# Patient Record
Sex: Female | Born: 1975
Health system: Southern US, Community
[De-identification: ages and names within clinical notes are randomized; demographics above are authoritative.]

## PROBLEM LIST (undated history)

## (undated) DIAGNOSIS — I050 Rheumatic mitral stenosis: Secondary | ICD-10-CM

## (undated) DIAGNOSIS — R002 Palpitations: Secondary | ICD-10-CM

## (undated) DIAGNOSIS — K649 Unspecified hemorrhoids: Secondary | ICD-10-CM

## (undated) DIAGNOSIS — I099 Rheumatic heart disease, unspecified: Secondary | ICD-10-CM

## (undated) HISTORY — PX: NO PAST SURGERIES: SHX2092

## (undated) HISTORY — DX: Rheumatic heart disease, unspecified: I09.9

## (undated) HISTORY — DX: Unspecified hemorrhoids: K64.9

---

## 2009-09-23 ENCOUNTER — Emergency Department (HOSPITAL_COMMUNITY): Admission: EM | Admit: 2009-09-23 | Discharge: 2009-09-23 | Payer: Self-pay | Admitting: Emergency Medicine

## 2009-09-26 ENCOUNTER — Ambulatory Visit: Payer: Self-pay | Admitting: Family Medicine

## 2009-09-26 DIAGNOSIS — K5900 Constipation, unspecified: Secondary | ICD-10-CM | POA: Insufficient documentation

## 2009-09-26 DIAGNOSIS — I099 Rheumatic heart disease, unspecified: Secondary | ICD-10-CM | POA: Insufficient documentation

## 2009-09-26 DIAGNOSIS — I050 Rheumatic mitral stenosis: Secondary | ICD-10-CM | POA: Insufficient documentation

## 2009-10-05 ENCOUNTER — Encounter: Payer: Self-pay | Admitting: Family Medicine

## 2009-11-10 ENCOUNTER — Ambulatory Visit: Payer: Self-pay | Admitting: Family Medicine

## 2009-11-10 DIAGNOSIS — K644 Residual hemorrhoidal skin tags: Secondary | ICD-10-CM | POA: Insufficient documentation

## 2009-12-26 ENCOUNTER — Encounter: Admission: RE | Admit: 2009-12-26 | Discharge: 2009-12-26 | Payer: Self-pay | Admitting: Infectious Diseases

## 2010-01-30 ENCOUNTER — Encounter: Payer: Self-pay | Admitting: *Deleted

## 2010-03-23 ENCOUNTER — Encounter: Payer: Self-pay | Admitting: Family Medicine

## 2010-03-27 ENCOUNTER — Ambulatory Visit: Payer: Self-pay | Admitting: Family Medicine

## 2010-03-27 DIAGNOSIS — L259 Unspecified contact dermatitis, unspecified cause: Secondary | ICD-10-CM | POA: Insufficient documentation

## 2010-11-07 NOTE — Miscellaneous (Signed)
Summary: NPI for [facility]  Clinical Lists Changes gave our NPI number for her last visit & 3 to come.Golden Circle RN  January 30, 2010 4:30 PM

## 2010-11-07 NOTE — Assessment & Plan Note (Signed)
Summary: f/u eo   Vital Signs:  Patient profile:   35 year old female Height:      62.25 inches Weight:      136.6 pounds BMI:     24.87 Temp:     97.7 degrees F oral Pulse rate:   73 / minute BP sitting:   100 / 59  (left arm) Cuff size:   regular  Vitals Entered By: Garen Grams LPN (November 10, 2009 1:51 PM) CC: f/u Is Patient Diabetic? No Pain Assessment Patient in pain? no        Primary Care Provider:  Renold Don MD  CC:  f/u.  History of Present Illness: 36 year old recent immigrant from [place] with history of Rheumatic heart disease.  1.  Heart failure:  saw cardiologist.  Letter included in Centricity notes.  In short, recommended patient continue Digoxin and Atenolol, discontinued Amiodarone because no definitive diagnosis of A. fib.  Patient does complaining of shortness of breath after walking one flight of steps.  Still has palpitations.  Some leg edema, improved with elevation.  However, she does endorse marked improvement in her symptoms, saying she only gets palpitations once a week.  Before it was every day.  Exertion brings on shortness of breath, rest relieves it.    2.  Constipation:  Patient states she has very hard stools.  Also reports growth on her rectum.  If she goes more than one day without having a bowel movement, defecation is very painful for her.  States when she wipes occasionaly sees blood on the toilet paper.  None in toilet bowel.    ROS:  no chest pain, no headaches, no nausea/vomiting.  No dysuria, no hematochezia, no melena, no abdominal pain.    Habits & Providers  Alcohol-Tobacco-Diet     Tobacco Status: never  Current Problems (verified): 1)  Hemorrhoids, External  (ICD-455.3) 2)  Constipation  (ICD-564.00) 3)  Rheumatic Mitral Stenosis  (ICD-394.0) 4)  Rheumatic Heart Disease  (ICD-398.90)  Current Medications (verified): 1)  Digoxin 0.25 Mg Tabs (Digoxin) .... Take 1 Pill Daily 2)  Atenolol 50 Mg Tabs (Atenolol) .... Take 1  Pill Daily 3)  Anusol-Hc 2.5 % Crea (Hydrocortisone) 4)  Colace 100 Mg Caps (Docusate Sodium) .... Take 1 Pill in Am and 1 Pill in Pm For Constipation 5)  Miralax  Powd (Polyethylene Glycol 3350) .... Put 1 Capful in Juice or Water Every Day For Constipation  Allergies (verified): No Known Drug Allergies  Social History: Smoking Status:  never  Physical Exam  General:  Vital signs reviewed Well-developed, well-nourished patient in NAD.  Awake, cooperative.  Mouth:  oral mucosa moist Neck:  no lymphadenopathy Lungs:  clear to ausculation, no crackles Heart:  RRR, opening snap heard over mitral valve.   Abdomen:  soft/nontender/nondistended.  No organomegaly, bowel sounds present.  Rectal:  External hermorrhoid visible on right anal verge.  Painful to palpation.  No thrombosis.  Normal sphincter tone.   Extremities:  trace edema bilaterally    Impression & Recommendations:  Problem # 1:  RHEUMATIC HEART DISEASE (ICD-398.90) Assessment Improved Improvement in symptoms since taking medication regularly.  Discontinued Amiodarone per cardiology.  Per most recent letter, stated they wanted to obtain Echo as well as Holter monitor to assess atrial enlargement and palpitations.  No record of either in Centricity or E-chart.  Will contact cardiologist to see if tests obtained.  Spent over 45 minutes with patient, with >30 minutes discussing implications of heart  failure.  Plan to see patient back in 2 months to re-evaluate.   Orders: [facility]- Est  Level 4 (16109)  Problem # 2:  HEMORRHOIDS, EXTERNAL (ICD-455.3) Assessment: New Non-thrombosed extrenal hemorrhoids.  Recommended patient take sitz baths.  Prescribed Anusol cream for relief.  Also discussed constipation, see next.   Orders: [facility]- Est  Level 4 (99214)  Problem # 3:  CONSTIPATION (ICD-564.00) Assessment: Unchanged Painful for patient secondary to stool hardness.  Discussed high fiber diet.  Also prescribed Colace as softener and  Miralax to help relieve constipation and relieve pain from hemorrhoids.   Her updated medication list for this problem includes:    Colace 100 Mg Caps (Docusate sodium) .Marland Kitchen... Take 1 pill in am and 1 pill in pm for constipation    Miralax Powd (Polyethylene glycol 3350) .Marland Kitchen... Put 1 capful in juice or water every day for constipation  Orders: [facility]- Est  Level 4 (60454)  Complete Medication List: 1)  Digoxin 0.25 Mg Tabs (Digoxin) .... Take 1 pill daily 2)  Atenolol 50 Mg Tabs (Atenolol) .... Take 1 pill daily 3)  Anusol-hc 2.5 % Crea (Hydrocortisone) 4)  Colace 100 Mg Caps (Docusate sodium) .... Take 1 pill in am and 1 pill in pm for constipation 5)  Miralax Powd (Polyethylene glycol 3350) .... Put 1 capful in juice or water every day for constipation Prescriptions: MIRALAX  POWD (POLYETHYLENE GLYCOL 3350) Put 1 capful in juice or water every day for constipation  #1 x 2   Entered and Authorized by:   Renold Don MD   Signed by:   Renold Don MD on 11/10/2009   Method used:   Print then Give to Patient   RxID:   [id number] COLACE 100 MG CAPS (DOCUSATE SODIUM) Take 1 pill in AM and 1 pill in PM for constipation  #60 x 3   Entered and Authorized by:   Renold Don MD   Signed by:   Renold Don MD on 11/10/2009   Method used:   Print then Give to Patient   RxID:   [id number] ANUSOL-HC 2.5 % CREA (HYDROCORTISONE)   #1 x 3   Entered and Authorized by:   Renold Don MD   Signed by:   Renold Don MD on 11/10/2009   Method used:   Print then Give to Patient   RxID:   [id number]

## 2010-11-07 NOTE — Assessment & Plan Note (Signed)
Summary: allergies  itching/[place]   Vital Signs:  Patient profile:   35 year old female Weight:      142.1 pounds Pulse rate:   70 / minute BP sitting:   100 / 60  (right arm) Cuff size:   regular  Vitals Entered By: Renato Battles slade,cma CC: left leg and face itching x 20 days. Is Patient Diabetic? No Pain Assessment Patient in pain? no        Primary Care Provider:  Renold Don MD  CC:  left leg and face itching x 20 days.Marland Kitchen  History of Present Illness: Megan Atkins comes in with itchy rash on legs, arm, and face for 3 weeks.  No fever or other symptoms.  REd lesions on skin that are dry and itch.  Has not had before, but recently emigrated here from [place].   Habits & Providers  Alcohol-Tobacco-Diet     Tobacco Status: never  Allergies: No Known Drug Allergies  Physical Exam  General:  thin, alert, NAD, vitals reviewed.  Skin:  pink plaques scattered over lower legs and forearms with one area on face with evidence of excoriation   Impression & Recommendations:  Problem # 1:  ECZEMA (ICD-692.9)  Triamcinolone for body and desonide for face.  Return if worsens or does not improve.  Her updated medication list for this problem includes:    Triamcinolone Acetonide 0.1 % Crea (Triamcinolone acetonide) .Marland Kitchen... Apply to affected, itchy areas two times a day as needed itching or rash.   do not apply to face. disp: 60 g    Desonide 0.05 % Crea (Desonide) .Marland Kitchen... Apply to affected, itchy areas on face two times a day as needed iching or rash disp: 40g  Orders: [facility]- Est Level  3 (78295)  Complete Medication List: 1)  Digoxin 0.25 Mg Tabs (Digoxin) .... Take 1 pill daily 2)  Atenolol 50 Mg Tabs (Atenolol) .... Take 1 pill daily 3)  Anusol-hc 2.5 % Crea (Hydrocortisone) 4)  Colace 100 Mg Caps (Docusate sodium) .... Take 1 pill in am and 1 pill in pm for constipation 5)  Miralax Powd (Polyethylene glycol 3350) .... Put 1 capful in juice or water every day for constipation 6)   Triamcinolone Acetonide 0.1 % Crea (Triamcinolone acetonide) .... Apply to affected, itchy areas two times a day as needed itching or rash.   do not apply to face. disp: 60 g 7)  Desonide 0.05 % Crea (Desonide) .... Apply to affected, itchy areas on face two times a day as needed iching or rash disp: 40g  Patient Instructions: 1)  Use the DESONIDE cream on your face. 2)  use the TRIAMCINOLONE cream on your body. 3)  You can use them twice a day for itching or rash.  Only apply them where you have itching or rash. 4)  If you are not feeling better by the end of the week, please let us know.  Prescriptions: DESONIDE 0.05 % CREA (DESONIDE) apply to affected, itchy areas on face two times a day as needed iching or rash disp: 40g  #1 x 3   Entered and Authorized by:   Ardeen Garland  MD   Signed by:   Ardeen Garland  MD on 03/27/2010   Method used:   Print then Give to Patient   RxID:   [id number] TRIAMCINOLONE ACETONIDE 0.1 % CREA (TRIAMCINOLONE ACETONIDE) apply to affected, itchy areas two times a day as needed itching or rash.   Do not apply to face. disp: 60 g  #  1 x 3   Entered and Authorized by:   Ardeen Garland  MD   Signed by:   Ardeen Garland  MD on 03/27/2010   Method used:   Print then Give to Patient   RxID:   [id number]

## 2010-11-07 NOTE — Miscellaneous (Signed)
Summary: needs appt  Clinical Lists Changes Mcneil Sober from [employer] called [phone]) & asked that I call & help this pt make an appt. the # is [phone]. I called & got a message that "the voicemailbox has not been set up".  Megan Atkins stated that her husband, [name] also wants to be a pt here. will ask front office staff to mail forms to him.   when pt calls me back, need to know what language she speaks so we can get an interpretor, either in person or using the phone.Golden Circle RN  March 23, 2010 3:42 PM  pt c/o itching & allergies on face & legs. appt made for tomorrow with Dr. Hulen Luster at 11am & arranged an interpretor who speaks Nepali.  she wants husband to be a pt here. told her he can fill the forms out & we will likely get him in next month. right now his feet & legs are swolen. told her to take him to [facility]. she spoke some english. Marland KitchenGolden Circle RN  March 23, 2010 4:01 PM

## 2011-06-06 ENCOUNTER — Telehealth: Payer: Self-pay | Admitting: Family Medicine

## 2011-06-06 NOTE — Telephone Encounter (Signed)
Megan Atkins has had and been treated for TB in the passed.  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  She was told that she would need to get an X-Ray every year.  She is calling because she wants to know if she needs to be seen to get the order/referral for the X-Ray.  I inquired with Dennison Nancy and she said to send Dr. Gwendolyn Grant a message and he may be willing to order the X-Ray.

## 2011-06-08 NOTE — Telephone Encounter (Signed)
LVM for patient to call back to inform of below 

## 2011-06-08 NOTE — Telephone Encounter (Signed)
I've actually never seen her before in clinic.  I would feel more comfortable if she makes an appointment rather than just sending her for xray.  Would like to know about night sweats, weight loss, etc.  Can we call her to set up this appt?

## 2011-06-13 NOTE — Telephone Encounter (Signed)
Spoke with patient and set up appointment for 9/6 @ 2:30pm

## 2011-06-14 ENCOUNTER — Ambulatory Visit: Payer: Self-pay | Admitting: Family Medicine

## 2011-06-21 ENCOUNTER — Ambulatory Visit (INDEPENDENT_AMBULATORY_CARE_PROVIDER_SITE_OTHER): Payer: BC Managed Care – PPO | Admitting: Family Medicine

## 2011-06-21 ENCOUNTER — Encounter: Payer: Self-pay | Admitting: Family Medicine

## 2011-06-21 DIAGNOSIS — R7611 Nonspecific reaction to tuberculin skin test without active tuberculosis: Secondary | ICD-10-CM | POA: Insufficient documentation

## 2011-06-21 DIAGNOSIS — I099 Rheumatic heart disease, unspecified: Secondary | ICD-10-CM

## 2011-06-21 DIAGNOSIS — K5900 Constipation, unspecified: Secondary | ICD-10-CM

## 2011-06-21 MED ORDER — LACTULOSE 10 GM/15ML PO SOLN: 20.0000 g | Freq: Three times a day (TID) | ORAL | Status: DC

## 2011-06-21 MED ORDER — DOCUSATE SODIUM 100 MG PO CAPS: 100.0000 mg | ORAL_CAPSULE | Freq: Two times a day (BID) | ORAL | Status: DC

## 2011-06-21 NOTE — Assessment & Plan Note (Signed)
Plan to obtain 2 view chest x-ray. Will call patient with results. She has received adequate treatment. I do not suspect she has new infection.

## 2011-06-21 NOTE — Assessment & Plan Note (Signed)
Lactulose is covered by her insurance plan. Will refill Colace and a prescription for lactulose. Discuss that if she does not like the lactulose she can go back to MiraLax but that this is an over-the-counter medicine. Also discussed that since she does not like to take medicines she can also just try a trial of Colace and prune juice.

## 2011-06-21 NOTE — Progress Notes (Signed)
  Subjective:    Patient ID: Megan Atkins, female    DOB: 11-Jul-1976, 35 y.o.   MRN: [id number]  HPI 1. Positive PPD:  Had positive PPD in 2011. Status post 9 months of what I presume to be as isoniazid treatment. 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 They're requiring her to have a chest x-ray. She is here today to inquire about the chest x-ray. No cough no fevers no chills. No night sweats no weight loss. No change in appetite.  2.  Constipation:  History of this. I have treated her for this the past with Colace and MiraLax. Maalox has not paid for by her insurance issues and she wonders if there is something that insurance will pay for. Describes hard stools once every several days. No rectal bleeding. Does have history of hemorrhoids. No pain or itching.  3.  Rheumatic heart disease:  Patient is on 50 mg of atenolol and 0.25 g of digoxin daily. 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 Her blood pressure and pulse have always been in the 90s systolic and heart rate in the 60s since taking the atenolol. She describes fatigue and sometimes lightheaded symptoms when she stands quickly. Denies any coughing fevers chills shortness of breath chest pain. Very occasional palpitations. Wants to know about changing her medicine.   Review of Systems See HPI above for review of systems.       Objective:   Physical Exam Gen:  Alert, cooperative patient who appears stated age in no acute distress.  Vital signs reviewed. HEENT:  [place]/AT.  EOMI, PERRL.  MMM, tonsils non-erythematous, non-edematous.  External ears WNL, Bilateral TM's normal without retraction, redness or bulging.  Cardiac:  Regular rate and rhythm without murmur auscultated.  Good S1/S2. Pulm:  Clear to auscultation bilaterally with good air movement.  No wheezes or rales noted.   Ext:  No clubbing/cyanosis/erythema.  No edema noted  bilateral lower extremities.   Rectal deferred       Assessment & Plan:

## 2011-06-21 NOTE — Patient Instructions (Signed)
I would cut the Atenolol in half because your blood pressure and heart rate are so low.  Take it once a day. This should make you feel better.  I have sent in the medicine for your constipation. I will schedule you for chest x-ray.

## 2011-06-21 NOTE — Assessment & Plan Note (Signed)
Plan decrease atenolol to 25 mg daily to help with her symptoms.. She is to follow up in 3 months. Patient states she had an echo about one month ago. Will need to obtain records. She was seen by cardiology GI cardiology. Her physician is Dr. Jayme Cloud. She has instructions to call if she starts having worsening shortness of breath productive cough lower extremity swelling or worsening palpitations he

## 2011-07-05 ENCOUNTER — Ambulatory Visit
Admission: RE | Admit: 2011-07-05 | Discharge: 2011-07-05 | Disposition: A | Discharge status: Home / Self Care | Payer: BC Managed Care – PPO | Source: Ambulatory Visit | Attending: Family Medicine | Admitting: Family Medicine

## 2011-07-05 DIAGNOSIS — R7611 Nonspecific reaction to tuberculin skin test without active tuberculosis: Secondary | ICD-10-CM

## 2011-07-06 ENCOUNTER — Telehealth: Payer: Self-pay | Admitting: Family Medicine

## 2011-07-06 NOTE — Telephone Encounter (Signed)
Asking for xray results and also wants to pick up a copy.

## 2011-07-09 ENCOUNTER — Encounter: Payer: Self-pay | Admitting: Family Medicine

## 2011-07-09 NOTE — Telephone Encounter (Signed)
Called and left message to return call.  Did not leave any information on voicemail.  Will mail letter to her.  Letter written, forwarded to admin staff.

## 2011-09-02 ENCOUNTER — Other Ambulatory Visit: Payer: Self-pay | Admitting: Family Medicine

## 2011-09-02 NOTE — Telephone Encounter (Signed)
Refill request

## 2011-10-26 ENCOUNTER — Ambulatory Visit (INDEPENDENT_AMBULATORY_CARE_PROVIDER_SITE_OTHER): Payer: Self-pay | Admitting: Family Medicine

## 2011-10-26 VITALS — BP 104/65 | HR 76 | Temp 98.3°F | Ht 62.25 in | Wt 149.0 lb

## 2011-10-26 DIAGNOSIS — K5900 Constipation, unspecified: Secondary | ICD-10-CM

## 2011-10-26 DIAGNOSIS — K625 Hemorrhage of anus and rectum: Secondary | ICD-10-CM

## 2011-10-26 MED ORDER — TUCKS 50 % EX PADS: MEDICATED_PAD | CUTANEOUS | Status: DC

## 2011-10-26 MED ORDER — LACTULOSE 10 GM/15ML PO SOLN: 20.0000 g | Freq: Three times a day (TID) | ORAL | Status: DC

## 2011-10-26 NOTE — Patient Instructions (Signed)
Thank you for coming in today. Use the lactulose as needed for constipation.  Use the tucks pads as needed for pain, itching and after bowel movements for hemorrhoids. If you are still having bleeding after having loose stools for a week please see Dr. Gwendolyn Grant. I would like you to followup with Dr. Gwendolyn Grant in at least 2-3 months.  Hemorrhoids Hemorrhoids are enlarged (dilated) veins around the rectum. There are 2 types of hemorrhoids, and the type of hemorrhoid is determined by its location.  Internal hemorrhoids occur in the veins just inside the rectum. They are usually not painful, but they may bleed. However, they may poke through to the outside and become irritated and painful. External hemorrhoids involve the veins outside the anus and can be felt as a painful swelling or hard lump near the anus. They are often itchy and may crack and bleed. Sometimes clots will form in the veins. This makes them swollen and painful. These are called thrombosed hemorrhoids. CAUSES Causes of hemorrhoids include:  Pregnancy. This increases the pressure in the hemorrhoidal veins.     Constipation.    Straining to have a bowel movement.     Obesity.    Heavy lifting or other activity that caused you to strain.  TREATMENT Most of the time hemorrhoids improve in 1 to 2 weeks. However, if symptoms do not seem to be getting better or if you have a lot of rectal bleeding, your caregiver may perform a procedure to help make the hemorrhoids get smaller or remove them completely. Possible treatments include:  Rubber band ligation. A rubber band is placed at the base of the hemorrhoid to cut off the circulation.     Sclerotherapy. A chemical is injected to shrink the hemorrhoid.     Infrared light therapy. Tools are used to burn the hemorrhoid.     Hemorrhoidectomy. This is surgical removal of the hemorrhoid.  HOME CARE INSTRUCTIONS    Increase fiber in your diet. Ask your caregiver about using fiber  supplements.     Drink enough water and fluids to keep your urine clear or pale yellow.     Exercise regularly.     Go to the bathroom when you have the urge to have a bowel movement. Do not wait.     Avoid straining to have bowel movements.     Keep the anal area dry and clean.     Only take over-the-counter or prescription medicines for pain, discomfort, or fever as directed by your caregiver.  If your hemorrhoids are thrombosed:  Take warm sitz baths for 20 to 30 minutes, 3 to 4 times per day.     If the hemorrhoids are very tender and swollen, place ice packs on the area as tolerated. Using ice packs between sitz baths may be helpful. Fill a plastic bag with ice. Place a towel between the bag of ice and your skin.     Medicated creams and suppositories may be used or applied as directed.     Do not use a donut-shaped pillow or sit on the toilet for long periods. This increases blood pooling and pain.  SEEK MEDICAL CARE IF:    You have increasing pain and swelling that is not controlled with your medicine.     You have uncontrolled bleeding.     You have difficulty or you are unable to have a bowel movement.     You have pain or inflammation outside the area of the hemorrhoids.  You have chills or an oral temperature above 102 F (38.9 C).  MAKE SURE YOU:    Understand these instructions.     Will watch your condition.     Will get help right away if you are not doing well or get worse.  Document Released: 09/21/2000 Document Revised: 06/06/2011 Document Reviewed: 01/27/2008 [facility] Patient Information 2012 [place], [place].

## 2011-10-30 DIAGNOSIS — K625 Hemorrhage of anus and rectum: Secondary | ICD-10-CM | POA: Insufficient documentation

## 2011-10-30 NOTE — Assessment & Plan Note (Signed)
Bright red rectal bleeding in the setting of constipation without diarrhea abdominal pain fevers or chills. This is all certainly due to the external hemorrhoid and rectal fissure/internal hemorrhoids that were present on anoscope exam.  Plan to treat with Tucks pad and lactulose.  We'll follow up with primary care provider. If not improving would encourage referral to gastroenterology for sigmoidoscopy or colonoscopy.  Suspicion for malignancy very low in this 36 year old woman with a clear reason to have rectal bleeding, however we don't want to let this issue go unresolved for a long period of time.

## 2011-10-30 NOTE — Progress Notes (Signed)
Megan Atkins is a 36 -year-old woman who presents to clinic today for constipation. She is being managed by her primary care provider Dr. Gwendolyn Grant for the last several months for constipation.  She previously was doing well on lactulose 3 times a day. However she stopped taking it a few weeks ago and developed worsening constipation as a result. The day before the visit Megan Gockel had a painful firm stool and bright red rectal bleeding.  The bleeding was minimal but was present on the toilet paper and in the toilet bowl.  She is obviously concerned presents to clinic today for evaluation.  PMH reviewed.  ROS as above otherwise neg Medications reviewed. Current Outpatient Prescriptions  Medication Sig Dispense Refill  . atenolol (TENORMIN) 50 MG tablet Take 50 mg by mouth daily.        Marland Kitchen desonide (DESOWEN) 0.05 % cream Apply topically. apply to affected, itchy areas on face two times a day as needed iching or rash disp: 40g       . digoxin (LANOXIN) 0.25 MG tablet Take 250 mcg by mouth daily.        . DOCQLACE 100 MG capsule TAKE 1 CAPSULE BY MOUTH IN THE MORNING AND 1 CAPSULE IN THE EVENING  60 capsule  3  . hydrocortisone (ANUSOL-HC) 2.5 % rectal cream Place rectally.        . lactulose (CHRONULAC) 10 GM/15ML solution Take 30 mLs (20 g total) by mouth 3 (three) times daily.  240 mL  0  . lactulose (CHRONULAC) 10 GM/15ML solution Take 30 mLs (20 g total) by mouth 3 (three) times daily.  960 mL  3  . triamcinolone (KENALOG) 0.1 % cream Apply topically as directed. apply to affected, itchy areas two times a day as needed itching or rash.   Do not apply to face. disp: 60 g       . Witch Hazel (TUCKS) 50 % PADS Use as needed for hemmerhoid pain/itching  40 each  3  . DISCONTD: polyethylene glycol (MIRALAX) powder Take 17 g by mouth daily. Put 1 capful in juice or water every day for constipation         Exam:  BP 104/65  Pulse 76  Temp(Src) 98.3 F (36.8 C) (Oral)  Ht 5' 2.25" (1.581 m)  Wt 149 lb  (67.586 kg)  BMI 27.03 kg/m2 Gen: Well NAD Lungs: CTABL Nl WOB Heart: RRR no MRG Abd: NABS, NT, ND Rectal: External hemorrhoids present. Anoscope exam reveals small internal hemorrhoid versus rectal fissure.  No significant stool in the rectal vault. Rectal tone normal.

## 2011-10-30 NOTE — Assessment & Plan Note (Signed)
Plan to resume lactulose, and followup with primary care provider in the next few weeks. We'll see Dr. Gwendolyn Grant sooner if worsening.

## 2011-12-21 ENCOUNTER — Other Ambulatory Visit: Payer: Self-pay | Admitting: Family Medicine

## 2011-12-21 NOTE — Telephone Encounter (Signed)
Refill request

## 2012-03-22 ENCOUNTER — Other Ambulatory Visit: Payer: Self-pay | Admitting: Family Medicine

## 2012-03-24 ENCOUNTER — Encounter: Payer: Self-pay | Admitting: *Deleted

## 2012-03-24 NOTE — Telephone Encounter (Signed)
This encounter was created in error - please disregard.

## 2012-06-06 ENCOUNTER — Other Ambulatory Visit: Payer: Self-pay | Admitting: Family Medicine

## 2012-06-10 ENCOUNTER — Encounter: Payer: Self-pay | Admitting: *Deleted

## 2012-06-10 NOTE — Telephone Encounter (Signed)
This encounter was created in error - please disregard.

## 2012-07-22 ENCOUNTER — Ambulatory Visit (INDEPENDENT_AMBULATORY_CARE_PROVIDER_SITE_OTHER): Payer: Self-pay | Admitting: Family Medicine

## 2012-07-22 VITALS — BP 99/60

## 2012-07-22 DIAGNOSIS — K625 Hemorrhage of anus and rectum: Secondary | ICD-10-CM

## 2012-07-22 MED ORDER — HYDROCORTISONE ACETATE 25 MG RE SUPP: 25.0000 mg | Freq: Two times a day (BID) | RECTAL | Status: DC

## 2012-07-22 MED ORDER — POLYETHYLENE GLYCOL 3350 17 GM/SCOOP PO POWD: 17.0000 g | Freq: Two times a day (BID) | ORAL | Status: DC | PRN

## 2012-07-22 NOTE — Patient Instructions (Addendum)
Take the hydrocortisone suppository two times a day for the next 5 days and then no longer.      Take a supplement with psyllium fiber in it every day.  Use one scoop in your drink.  Take the Miralax 2-3 scoops a day until you have a bowel movement.  Stop using this if you have loose stools.  Make sure to get the [facility] and then come back to see me.

## 2012-07-23 NOTE — Assessment & Plan Note (Addendum)
Likely internal hemorrhoids due to scant bleeding only on stool.  Has always had borderline low blood pressures, so not likely secondary to blood loss.   She is not taking stool softeners or any of the other medications in list except lactulose.  She would like to try something else. Discussed both verbally and in written form (she tells me she can read english pretty well) change in regimen.  Recommended stool softener and hydrocortisone suppositories to treat. Switching to Miralax.   Recommended [employer] as she has no insurance and follow up after she obtains this or sooner if worsening.   She expressed to me understanding of plan.

## 2012-07-23 NOTE — Progress Notes (Signed)
  Subjective:    Patient ID: Megan Atkins, female    DOB: Nov 05, 1975, 35 y.o.   MRN: [id number]  HPI  1.  Rectal bleeding:  Scant bright red blood on stool for past several days.  No melena and no blood in toilet bowel.  Some scant blood when she wipes.  Has been ongoing issue for patient in past.  Anoscopy in January revealed internal and external hemorrhoids.  No rectal pain, just chronic pruritis.  Does have daily small hard bowel movements that sometimes alternate with diarrhea. Has list of medications on her Med List but only takes Lactulose on this list, none of the witch hazel, stool softener, etc.  There is some language barrier but patient declines phone interpretative services.     Review of Systems No weight loss.  No abdominal pain.  NO fevers or chills.  No lightheadedness.      Objective:   Physical Exam Gen:  Alert, cooperative patient who appears stated age in no acute distress.  Vital signs reviewed. Abd:  Soft/nondistended/nontender.  Good bowel sounds throughout all four quadrants.  No masses noted.  Rectal:  Deferred at patient's request (encouraged patient to allow rectal exam but she continued to defer this)       Assessment & Plan:

## 2012-10-06 ENCOUNTER — Ambulatory Visit (INDEPENDENT_AMBULATORY_CARE_PROVIDER_SITE_OTHER): Payer: No Typology Code available for payment source | Admitting: Family Medicine

## 2012-10-06 ENCOUNTER — Encounter: Payer: Self-pay | Admitting: Family Medicine

## 2012-10-06 VITALS — BP 105/68 | HR 71 | Temp 98.0°F | Ht 62.25 in | Wt 151.6 lb

## 2012-10-06 DIAGNOSIS — I099 Rheumatic heart disease, unspecified: Secondary | ICD-10-CM

## 2012-10-06 DIAGNOSIS — K644 Residual hemorrhoidal skin tags: Secondary | ICD-10-CM

## 2012-10-06 DIAGNOSIS — K625 Hemorrhage of anus and rectum: Secondary | ICD-10-CM

## 2012-10-06 LAB — BASIC METABOLIC PANEL
BUN: 8 mg/dL (ref 6–23)
CO2: 27 mEq/L (ref 19–32)
Calcium: 9.1 mg/dL (ref 8.4–10.5)
Chloride: 106 mEq/L (ref 96–112)
Creat: 0.5 mg/dL (ref 0.50–1.10)
Glucose, Bld: 86 mg/dL (ref 70–99)
Potassium: 4.1 mEq/L (ref 3.5–5.3)
Sodium: 140 mEq/L (ref 135–145)

## 2012-10-06 LAB — CBC
HCT: 38.9 % (ref 36.0–46.0)
Hemoglobin: 13.5 g/dL (ref 12.0–15.0)
MCH: 28.4 pg (ref 26.0–34.0)
MCHC: 34.7 g/dL (ref 30.0–36.0)
MCV: 81.7 fL (ref 78.0–100.0)
Platelets: 248 10*3/uL (ref 150–400)
RBC: 4.76 MIL/uL (ref 3.87–5.11)
RDW: 14 % (ref 11.5–15.5)
WBC: 7.6 10*3/uL (ref 4.0–10.5)

## 2012-10-06 MED ORDER — LACTULOSE 10 GM/15ML PO SOLN: 10.0000 g | Freq: Three times a day (TID) | ORAL | Status: DC

## 2012-10-06 MED ORDER — ATENOLOL 50 MG PO TABS: 50.0000 mg | ORAL_TABLET | Freq: Every day | ORAL | Status: DC

## 2012-10-06 NOTE — Patient Instructions (Addendum)
Take the Lactulose to help with bowel movements.    Call and ask the [facility] if they have this medicine.  You will get a call about going to see surgery.    The other medicines like Atenolol will be at the [facility].    Have a Happy New Year!

## 2012-10-06 NOTE — Progress Notes (Signed)
  Subjective:    Patient ID: Megan Atkins, female    DOB: 1976-01-23, 36 y.o.   MRN: [id number]  HPI  1.  Rectal bleeding and hemorrhoids: Ever since last office visit patient has continued to have trouble with this. She complains of intermittent rectal bleeding and pain after having constipation. She takes lactulose as well as MiraLax and a stool softener to help relieve her constipation. However she can often not afford to purchase either the MiraLax or the lactulose and therefore has more trouble with hemorrhoids.  She had trouble with bright rectal bleeding and what sounds like episode of diverticulosis back in October. Since then she's had no further bright red blood in the toilet bowl. However she has had blood streaks her stool. She is currently doing well and has been in the past 4-5 days with no bleeding or pain. However she states the pain occasionally is enough that she has to use a doughnut cushion in order to set.  Eating and drinking well. No nausea vomiting. No lightheadedness.  2.  History of Rheumatic heart disease: Patient is a immigrant from [place]. She's been here for about 2 years. When she first presented to me she been on amiodarone for an unknown number of years. She has a history of rheumatic heart disease that in the past was treated with digoxin, atenolol, and  amiodarone. We stopped her amiodarone and digoxin at that time (2 years ago) and she's been doing well without palpitations since then. She's been continued on her atenolol the past 2 years without any trouble. Occasionally she does describe some palpitations the worse time being when she had episode of bright red blood per rectum in October 2 see above. She's had no dyspnea or chest pain. No palpitations since bleeding episode in October.  She has seen him to cardiology once but did not have any EKG or any lab work done because she did not have insurance. She was therefore unable to follow up with them. That was about one  and a half years ago in the past.   Review of Systems See HPI above for review of systems.       Objective:   Physical Exam Gen:  Alert, cooperative patient who appears stated age in no acute distress.  Vital signs reviewed. HEENT:  EOMI, MMM Cardiac:  Regular rate and rhythm  Pulm:  Clear to auscultation bilaterally with good air movement.  No wheezes or rales noted.   Abd:  Soft/nondistended/nontender.  Good bowel sounds throughout all four quadrants.  No masses noted.  Ext:  No edema BL       Assessment & Plan:

## 2012-10-06 NOTE — Assessment & Plan Note (Signed)
Patient would like to be followed by Cardiologist again. Last seen at [facility] about 1.5 years ago but basically declined treatment because she could not pay for exam. Will refer for further evaluation and recommendations to [facility] today.   Asymptomatic regarding rheumatic heart disease.   Diagnosis came from records sent over with her from [place].

## 2012-10-06 NOTE — Assessment & Plan Note (Signed)
Patient again declined rectal exam.  She would like definitive treatment for her hemorhoids and therefore I am referring her to surgery for further evaluation and recommendations. I have refilled her Lactulose with hopes this can be refilled at reduced cost at [facility] pharmacy to prevent constipation.  Provided red flags regarding [facility]

## 2012-10-09 MED ORDER — POLYETHYLENE GLYCOL 3350 17 GM/SCOOP PO POWD: 17.0000 g | Freq: Two times a day (BID) | ORAL | Status: DC | PRN

## 2012-10-09 NOTE — Addendum Note (Signed)
Addended byGwendolyn Grant, Newt Lukes on: 10/09/2012 02:45 PM   Modules accepted: Orders

## 2012-10-15 ENCOUNTER — Ambulatory Visit (INDEPENDENT_AMBULATORY_CARE_PROVIDER_SITE_OTHER): Payer: Self-pay | Admitting: Surgery

## 2012-10-20 ENCOUNTER — Ambulatory Visit (INDEPENDENT_AMBULATORY_CARE_PROVIDER_SITE_OTHER): Payer: PRIVATE HEALTH INSURANCE | Admitting: Surgery

## 2012-10-20 ENCOUNTER — Encounter (INDEPENDENT_AMBULATORY_CARE_PROVIDER_SITE_OTHER): Payer: Self-pay | Admitting: Surgery

## 2012-10-20 VITALS — BP 112/70 | HR 68 | Temp 98.6°F | Resp 14 | Ht 62.0 in | Wt 151.4 lb

## 2012-10-20 DIAGNOSIS — K602 Anal fissure, unspecified: Secondary | ICD-10-CM

## 2012-10-20 DIAGNOSIS — K644 Residual hemorrhoidal skin tags: Secondary | ICD-10-CM

## 2012-10-20 NOTE — Patient Instructions (Signed)
Avoid constipation - stool softeners, miralax Frequent sitz baths Diltiazem ointment

## 2012-10-20 NOTE — Progress Notes (Signed)
Patient ID: Megan Atkins, female   DOB: [birth year], 37 y.o.   MRN: [id number]  Chief Complaint  Patient presents with  . Rectal Problems    HPI Megan Atkins is a 37 y.o. female.  Referred by Dr. Gwendolyn Grant for evaluation of bleeding hemorrhoids HPI This is a 37 year old female from [place] who presents with several months of constipation that has resulted in some significant pain, bleeding with bowel movements, and some swelling in the perianal region. She has been using some MiraLAX but still has some constipation. She comes in today for surgical evaluation. Past Medical History  Diagnosis Date  . Rheumatic heart disease   . Hemorrhoid     History reviewed. No pertinent past surgical history.  History reviewed. No pertinent family history. No history of colon cancer  Social History History  Substance Use Topics  . Smoking status: Not on file  . Smokeless tobacco: Not on file  . 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  No Known Allergies  Current Outpatient Prescriptions  Medication Sig Dispense Refill  . atenolol (TENORMIN) 50 MG tablet Take 1 tablet (50 mg total) by mouth daily.  30 tablet  3  . desonide (DESOWEN) 0.05 % cream Apply topically. apply to affected, itchy areas on face two times a day as needed iching or rash disp: 40g       . digoxin (LANOXIN) 0.25 MG tablet Take 250 mcg by mouth daily.        . hydrocortisone (ANUSOL-HC) 25 MG suppository Place 1 suppository (25 mg total) rectally 2 (two) times daily.  12 suppository  0  . polyethylene glycol powder (GLYCOLAX/MIRALAX) powder Take 17 g by mouth 2 (two) times daily as needed.  3350 g  1  . polyethylene glycol powder (GLYCOLAX/MIRALAX) powder Take 17 g by mouth 2 (two) times daily as needed.  3350 g  1  . triamcinolone (KENALOG) 0.1 % cream Apply topically as directed. apply to affected, itchy areas two times a day as needed itching or rash.   Do not apply to face. disp: 60 g         . Witch Hazel (TUCKS) 50 % PADS Use as needed for hemmerhoid pain/itching  40 each  3    Review of Systems Review of Systems  Constitutional: Negative for fever, chills and unexpected weight change.  HENT: Negative for hearing loss, congestion, sore throat, trouble swallowing and voice change.   Eyes: Negative for visual disturbance.  Respiratory: Negative for cough and wheezing.   Cardiovascular: Negative for chest pain, palpitations and leg swelling.  Gastrointestinal: Positive for anal bleeding and rectal pain. Negative for nausea, vomiting, abdominal pain, diarrhea, constipation, blood in stool and abdominal distention.  Genitourinary: Negative for hematuria, vaginal bleeding and difficulty urinating.  Musculoskeletal: Negative for arthralgias.  Skin: Negative for rash and wound.  Neurological: Negative for seizures, syncope and headaches.  Hematological: Negative for adenopathy. Does not bruise/bleed easily.  Psychiatric/Behavioral: Negative for confusion.    Blood pressure 112/70, pulse 68, temperature 98.6 F (37 C), temperature source Temporal, resp. rate 14, height 5\' 2"  (1.575 m), weight 151 lb 6.4 oz (68.675 kg).  Physical Exam Physical Exam WDWN in NAD Rectal - minimal external hemorrhoid disease - no sign of thrombosis or inflammation Small posterior midline anal fissure Minimal internal hemorrhoid disease Data Reviewed none  Assessment    Posterior anal fissure    Plan    Fiber supplement, miralax, stool  softeners Frequent sitz baths Diltiazem ointment Follow-up 1 month.        Arye Weyenberg K. 10/20/2012, 2:42 PM

## 2012-11-08 DIAGNOSIS — K649 Unspecified hemorrhoids: Secondary | ICD-10-CM

## 2012-11-08 HISTORY — DX: Unspecified hemorrhoids: K64.9

## 2012-11-11 ENCOUNTER — Encounter: Payer: Self-pay | Admitting: Family Medicine

## 2012-11-11 NOTE — Telephone Encounter (Signed)
Pt is asking about referre

## 2012-11-11 NOTE — Telephone Encounter (Signed)
This encounter was created in error - please disregard.

## 2012-11-12 ENCOUNTER — Ambulatory Visit: Payer: No Typology Code available for payment source | Admitting: Cardiovascular Disease

## 2012-11-14 ENCOUNTER — Ambulatory Visit (INDEPENDENT_AMBULATORY_CARE_PROVIDER_SITE_OTHER): Payer: PRIVATE HEALTH INSURANCE | Admitting: Surgery

## 2012-11-14 ENCOUNTER — Encounter (INDEPENDENT_AMBULATORY_CARE_PROVIDER_SITE_OTHER): Payer: Self-pay | Admitting: Surgery

## 2012-11-14 VITALS — BP 108/64 | HR 70 | Temp 97.3°F | Resp 16 | Ht 62.0 in | Wt 150.5 lb

## 2012-11-14 DIAGNOSIS — K625 Hemorrhage of anus and rectum: Secondary | ICD-10-CM

## 2012-11-14 DIAGNOSIS — K644 Residual hemorrhoidal skin tags: Secondary | ICD-10-CM

## 2012-11-14 DIAGNOSIS — K602 Anal fissure, unspecified: Secondary | ICD-10-CM

## 2012-11-14 NOTE — Progress Notes (Signed)
Patient ID: Megan Atkins, female DOB: 1975-10-28, 37 y.o. MRN: [id number]  Chief Complaint   Patient presents with   .  Rectal Problems   HPI  Megan Atkins is a 37 y.o. female. Referred by Dr. Gwendolyn Grant for evaluation of bleeding hemorrhoids  HPI  This is a 37 year old female from [place] who presents with several months of constipation that has resulted in some significant pain, bleeding with bowel movements, and some swelling in the perianal region. She has been using some MiraLAX. She comes in today for surgical evaluation.   She has been using diltiazem ointment for anal fissure, but the pain and bleeding seems to be worse.  She denies any constipation.   Past Medical History   Diagnosis  Date   .  Rheumatic heart disease    .  Hemorrhoid    History reviewed. No pertinent past surgical history.  History reviewed. No pertinent family history.  No history of colon cancer  Social History  History   Substance Use Topics   .  Smoking status:  Not on file   .  Smokeless tobacco:  Not on file   .  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  No Known Allergies  Current Outpatient Prescriptions   Medication  Sig  Dispense  Refill   .  atenolol (TENORMIN) 50 MG tablet  Take 1 tablet (50 mg total) by mouth daily.  30 tablet  3   .  desonide (DESOWEN) 0.05 % cream  Apply topically. apply to affected, itchy areas on face two times a day as needed iching or rash disp: 40g     .  digoxin (LANOXIN) 0.25 MG tablet  Take 250 mcg by mouth daily.     .  hydrocortisone (ANUSOL-HC) 25 MG suppository  Place 1 suppository (25 mg total) rectally 2 (two) times daily.  12 suppository  0   .  polyethylene glycol powder (GLYCOLAX/MIRALAX) powder  Take 17 g by mouth 2 (two) times daily as needed.  3350 g  1   .  polyethylene glycol powder (GLYCOLAX/MIRALAX) powder  Take 17 g by mouth 2 (two) times daily as needed.  3350 g  1   .  triamcinolone (KENALOG) 0.1 % cream  Apply  topically as directed. apply to affected, itchy areas two times a day as needed itching or rash. Do not apply to face. disp: 60 g     .  Witch Hazel (TUCKS) 50 % PADS  Use as needed for hemmerhoid pain/itching  40 each  3   Review of Systems  Review of Systems  Constitutional: Negative for fever, chills and unexpected weight change.  HENT: Negative for hearing loss, congestion, sore throat, trouble swallowing and voice change.  Eyes: Negative for visual disturbance.  Respiratory: Negative for cough and wheezing.  Cardiovascular: Negative for chest pain, palpitations and leg swelling.  Gastrointestinal: Positive for anal bleeding and rectal pain. Negative for nausea, vomiting, abdominal pain, diarrhea, constipation, blood in stool and abdominal distention.  Genitourinary: Negative for hematuria, vaginal bleeding and difficulty urinating.  Musculoskeletal: Negative for arthralgias.  Skin: Negative for rash and wound.  Neurological: Negative for seizures, syncope and headaches.  Hematological: Negative for adenopathy. Does not bruise/bleed easily.  Psychiatric/Behavioral: Negative for confusion.  Blood pressure 112/70, pulse 68, temperature 98.6 F (37 C), temperature source Temporal, resp. rate 14, height 5\' 2"  (1.575 m), weight 151 lb 6.4 oz (68.675 kg).  Physical Exam  Physical Exam  WDWN in NAD  Lungs CTA B CV - RRR Abd - soft, nt Rectal - moderate-sized right external hemorrhoid - no sign of thrombosis or inflammation; very tender to palpation Small posterior midline anal fissure - almost healed Minimal internal hemorrhoid disease - very tender to digital rectal examination  Data Reviewed  none  Assessment Posterior midline anal fissure - almost healed Painful enlarged external hemorrhoid Hematochezia  Recommend examination under anesthesia and hemorrhoidectomy.  She has failed conservative therapy.  The surgical procedure has been discussed with the patient.  Potential risks,  benefits, alternative treatments, and expected outcomes have been explained.  All of the patient's questions at this time have been answered.  The likelihood of reaching the patient's treatment goal is good.  The patient understand the proposed surgical procedure and wishes to proceed.  Wilmon Arms. Corliss Skains, MD, [facility]  11/14/2012 12:47 PM

## 2012-11-17 ENCOUNTER — Encounter (INDEPENDENT_AMBULATORY_CARE_PROVIDER_SITE_OTHER): Payer: PRIVATE HEALTH INSURANCE | Admitting: Surgery

## 2012-11-17 ENCOUNTER — Encounter (HOSPITAL_BASED_OUTPATIENT_CLINIC_OR_DEPARTMENT_OTHER): Payer: Self-pay | Admitting: *Deleted

## 2012-11-17 NOTE — Pre-Procedure Instructions (Signed)
Cardiology notes and EKG req. from [facility]

## 2012-11-18 ENCOUNTER — Encounter (HOSPITAL_BASED_OUTPATIENT_CLINIC_OR_DEPARTMENT_OTHER): Payer: Self-pay | Admitting: Anesthesiology

## 2012-11-18 ENCOUNTER — Ambulatory Visit (HOSPITAL_BASED_OUTPATIENT_CLINIC_OR_DEPARTMENT_OTHER): Payer: No Typology Code available for payment source | Admitting: Anesthesiology

## 2012-11-18 ENCOUNTER — Ambulatory Visit (HOSPITAL_BASED_OUTPATIENT_CLINIC_OR_DEPARTMENT_OTHER)
Admission: RE | Admit: 2012-11-18 | Discharge: 2012-11-18 | Disposition: A | Discharge status: Home / Self Care | Payer: No Typology Code available for payment source | Source: Ambulatory Visit | Attending: Surgery | Admitting: Surgery

## 2012-11-18 ENCOUNTER — Encounter (HOSPITAL_BASED_OUTPATIENT_CLINIC_OR_DEPARTMENT_OTHER): Payer: Self-pay | Admitting: *Deleted

## 2012-11-18 ENCOUNTER — Encounter (HOSPITAL_BASED_OUTPATIENT_CLINIC_OR_DEPARTMENT_OTHER)
Admission: RE | Disposition: A | Discharge status: Home / Self Care | Payer: Self-pay | Source: Ambulatory Visit | Attending: Surgery

## 2012-11-18 DIAGNOSIS — K602 Anal fissure, unspecified: Secondary | ICD-10-CM

## 2012-11-18 DIAGNOSIS — K649 Unspecified hemorrhoids: Secondary | ICD-10-CM

## 2012-11-18 DIAGNOSIS — K644 Residual hemorrhoidal skin tags: Secondary | ICD-10-CM | POA: Insufficient documentation

## 2012-11-18 DIAGNOSIS — Z01812 Encounter for preprocedural laboratory examination: Secondary | ICD-10-CM | POA: Insufficient documentation

## 2012-11-18 DIAGNOSIS — Z01810 Encounter for preprocedural cardiovascular examination: Secondary | ICD-10-CM | POA: Insufficient documentation

## 2012-11-18 DIAGNOSIS — K648 Other hemorrhoids: Secondary | ICD-10-CM | POA: Insufficient documentation

## 2012-11-18 HISTORY — DX: Palpitations: R00.2

## 2012-11-18 HISTORY — PX: HEMORRHOID SURGERY: SHX153

## 2012-11-18 HISTORY — DX: Rheumatic mitral stenosis: I05.0

## 2012-11-18 LAB — POCT HEMOGLOBIN-HEMACUE: Hemoglobin: 14.2 g/dL (ref 12.0–15.0)

## 2012-11-18 SURGERY — HEMORRHOIDECTOMY
Anesthesia: General | Site: Rectum | Wound class: Contaminated

## 2012-11-18 MED ORDER — ONDANSETRON HCL 4 MG/2ML IJ SOLN: 4.0000 mg | Freq: Four times a day (QID) | INTRAMUSCULAR | Status: DC | PRN

## 2012-11-18 MED ORDER — OXYCODONE HCL 5 MG PO TABS: 5.0000 mg | ORAL_TABLET | Freq: Once | ORAL | Status: DC | PRN

## 2012-11-18 MED ORDER — FENTANYL CITRATE 0.05 MG/ML IJ SOLN
INTRAMUSCULAR | Status: DC | PRN
  Administered 2012-11-18: 100 ug via INTRAVENOUS

## 2012-11-18 MED ORDER — BUPIVACAINE LIPOSOME 1.3 % IJ SUSP
INTRAMUSCULAR | Status: DC | PRN
  Administered 2012-11-18: 20 mL

## 2012-11-18 MED ORDER — DEXAMETHASONE SODIUM PHOSPHATE 4 MG/ML IJ SOLN
INTRAMUSCULAR | Status: DC | PRN
  Administered 2012-11-18: 10 mg via INTRAVENOUS

## 2012-11-18 MED ORDER — OXYCODONE-ACETAMINOPHEN 5-325 MG PO TABS: 1.0000 | ORAL_TABLET | ORAL | Status: DC | PRN

## 2012-11-18 MED ORDER — MIDAZOLAM HCL 2 MG/ML PO SYRP: 12.0000 mg | ORAL_SOLUTION | Freq: Once | ORAL | Status: DC | PRN

## 2012-11-18 MED ORDER — MIDAZOLAM HCL 5 MG/5ML IJ SOLN
INTRAMUSCULAR | Status: DC | PRN
  Administered 2012-11-18: 2 mg via INTRAVENOUS

## 2012-11-18 MED ORDER — LACTATED RINGERS IV SOLN
INTRAVENOUS | Status: DC
  Administered 2012-11-18 (×2): via INTRAVENOUS

## 2012-11-18 MED ORDER — FENTANYL CITRATE 0.05 MG/ML IJ SOLN
25.0000 ug | INTRAMUSCULAR | Status: DC | PRN
  Administered 2012-11-18: 50 ug via INTRAVENOUS
  Administered 2012-11-18 (×2): 25 ug via INTRAVENOUS

## 2012-11-18 MED ORDER — FENTANYL CITRATE 0.05 MG/ML IJ SOLN: 50.0000 ug | INTRAMUSCULAR | Status: DC | PRN

## 2012-11-18 MED ORDER — OXYCODONE HCL 5 MG/5ML PO SOLN: 5.0000 mg | Freq: Once | ORAL | Status: DC | PRN

## 2012-11-18 MED ORDER — PROPOFOL 10 MG/ML IV BOLUS
INTRAVENOUS | Status: DC | PRN
  Administered 2012-11-18: 160 mg via INTRAVENOUS

## 2012-11-18 MED ORDER — ONDANSETRON HCL 4 MG/2ML IJ SOLN: 4.0000 mg | INTRAMUSCULAR | Status: DC | PRN

## 2012-11-18 MED ORDER — MIDAZOLAM HCL 2 MG/2ML IJ SOLN: 1.0000 mg | INTRAMUSCULAR | Status: DC | PRN

## 2012-11-18 MED ORDER — LIDOCAINE HCL (CARDIAC) 20 MG/ML IV SOLN
INTRAVENOUS | Status: DC | PRN
  Administered 2012-11-18: 70 mg via INTRAVENOUS

## 2012-11-18 MED ORDER — HYDROMORPHONE HCL PF 1 MG/ML IJ SOLN: 1.0000 mg | INTRAMUSCULAR | Status: DC | PRN

## 2012-11-18 MED ORDER — ONDANSETRON HCL 4 MG/2ML IJ SOLN
INTRAMUSCULAR | Status: DC | PRN
  Administered 2012-11-18: 4 mg via INTRAVENOUS

## 2012-11-18 SURGICAL SUPPLY — 39 items
BLADE HEX COATED 2.75 (ELECTRODE) IMPLANT
BLADE SURG 15 STRL LF DISP TIS (BLADE) ×1 IMPLANT
BLADE SURG 15 STRL SS (BLADE) ×1
CANISTER SUCTION 1200CC (MISCELLANEOUS) ×2 IMPLANT
CLOTH BEACON ORANGE TIMEOUT ST (SAFETY) ×2 IMPLANT
DECANTER SPIKE VIAL GLASS SM (MISCELLANEOUS) IMPLANT
DRAPE UTILITY XL STRL (DRAPES) ×2 IMPLANT
DRSG PAD ABDOMINAL 8X10 ST (GAUZE/BANDAGES/DRESSINGS) ×2 IMPLANT
ELECT REM PT RETURN 9FT ADLT (ELECTROSURGICAL) ×2
ELECTRODE REM PT RTRN 9FT ADLT (ELECTROSURGICAL) ×1 IMPLANT
GAUZE SPONGE 4X4 12PLY STRL LF (GAUZE/BANDAGES/DRESSINGS) IMPLANT
GAUZE VASELINE 1X8 (GAUZE/BANDAGES/DRESSINGS) IMPLANT
GLOVE BIO SURGEON STRL SZ 6.5 (GLOVE) ×4 IMPLANT
GLOVE BIO SURGEON STRL SZ7 (GLOVE) ×4 IMPLANT
GLOVE BIOGEL PI IND STRL 7.5 (GLOVE) ×1 IMPLANT
GLOVE BIOGEL PI INDICATOR 7.5 (GLOVE) ×1
GOWN PREVENTION PLUS XLARGE (GOWN DISPOSABLE) ×4 IMPLANT
NEEDLE HYPO 25X1 1.5 SAFETY (NEEDLE) ×2 IMPLANT
NS IRRIG 1000ML POUR BTL (IV SOLUTION) ×2 IMPLANT
PACK BASIN DAY SURGERY FS (CUSTOM PROCEDURE TRAY) ×2 IMPLANT
PACK LITHOTOMY IV (CUSTOM PROCEDURE TRAY) ×2 IMPLANT
PENCIL BUTTON HOLSTER BLD 10FT (ELECTRODE) ×2 IMPLANT
SHEET MEDIUM DRAPE 40X70 STRL (DRAPES) ×2 IMPLANT
SLEEVE SCD COMPRESS KNEE MED (MISCELLANEOUS) ×2 IMPLANT
SPONGE SURGIFOAM ABS GEL 100 (HEMOSTASIS) ×2 IMPLANT
SURGILUBE 2OZ TUBE FLIPTOP (MISCELLANEOUS) ×2 IMPLANT
SUT VIC AB 3-0 SH 27 (SUTURE) ×2
SUT VIC AB 3-0 SH 27X BRD (SUTURE) ×2 IMPLANT
SYR BULB 3OZ (MISCELLANEOUS) ×2 IMPLANT
SYR CONTROL 10ML LL (SYRINGE) ×2 IMPLANT
TAPE CLOTH SURG 6X10 WHT LF (GAUZE/BANDAGES/DRESSINGS) ×2 IMPLANT
TOWEL OR 17X24 6PK STRL BLUE (TOWEL DISPOSABLE) ×2 IMPLANT
TOWEL OR NON WOVEN STRL DISP B (DISPOSABLE) ×2 IMPLANT
TRAY DSU PREP LF (CUSTOM PROCEDURE TRAY) ×2 IMPLANT
TRAY PROCTOSCOPIC FIBER OPTIC (SET/KITS/TRAYS/PACK) IMPLANT
TUBE CONNECTING 20X1/4 (TUBING) ×2 IMPLANT
UNDERPAD 30X30 INCONTINENT (UNDERPADS AND DIAPERS) ×2 IMPLANT
WATER STERILE IRR 1000ML POUR (IV SOLUTION) IMPLANT
YANKAUER SUCT BULB TIP NO VENT (SUCTIONS) ×2 IMPLANT

## 2012-11-18 NOTE — H&P (View-Only) (Signed)
Patient ID: Megan Atkins, female DOB: 02/05/1976, 36 y.o. MRN: [id number]  Chief Complaint   Patient presents with   .  Rectal Problems   HPI  Megan Raffel is a 37 y.o. female. Referred by Dr. Walden for evaluation of bleeding hemorrhoids  HPI  This is a 37-year-old female from [place] who presents with several months of constipation that has resulted in some significant pain, bleeding with bowel movements, and some swelling in the perianal region. She has been using some MiraLAX. She comes in today for surgical evaluation.   She has been using diltiazem ointment for anal fissure, but the pain and bleeding seems to be worse.  She denies any constipation.   Past Medical History   Diagnosis  Date   .  Rheumatic heart disease    .  Hemorrhoid    History reviewed. No pertinent past surgical history.  History reviewed. No pertinent family history.  No history of colon cancer  Social History  History   Substance Use Topics   .  Smoking status:  Not on file   .  Smokeless tobacco:  Not on file   .  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  No Known Allergies  Current Outpatient Prescriptions   Medication  Sig  Dispense  Refill   .  atenolol (TENORMIN) 50 MG tablet  Take 1 tablet (50 mg total) by mouth daily.  30 tablet  3   .  desonide (DESOWEN) 0.05 % cream  Apply topically. apply to affected, itchy areas on face two times a day as needed iching or rash disp: 40g     .  digoxin (LANOXIN) 0.25 MG tablet  Take 250 mcg by mouth daily.     .  hydrocortisone (ANUSOL-HC) 25 MG suppository  Place 1 suppository (25 mg total) rectally 2 (two) times daily.  12 suppository  0   .  polyethylene glycol powder (GLYCOLAX/MIRALAX) powder  Take 17 g by mouth 2 (two) times daily as needed.  3350 g  1   .  polyethylene glycol powder (GLYCOLAX/MIRALAX) powder  Take 17 g by mouth 2 (two) times daily as needed.  3350 g  1   .  triamcinolone (KENALOG) 0.1 % cream  Apply  topically as directed. apply to affected, itchy areas two times a day as needed itching or rash. Do not apply to face. disp: 60 g     .  Witch Hazel (TUCKS) 50 % PADS  Use as needed for hemmerhoid pain/itching  40 each  3   Review of Systems  Review of Systems  Constitutional: Negative for fever, chills and unexpected weight change.  HENT: Negative for hearing loss, congestion, sore throat, trouble swallowing and voice change.  Eyes: Negative for visual disturbance.  Respiratory: Negative for cough and wheezing.  Cardiovascular: Negative for chest pain, palpitations and leg swelling.  Gastrointestinal: Positive for anal bleeding and rectal pain. Negative for nausea, vomiting, abdominal pain, diarrhea, constipation, blood in stool and abdominal distention.  Genitourinary: Negative for hematuria, vaginal bleeding and difficulty urinating.  Musculoskeletal: Negative for arthralgias.  Skin: Negative for rash and wound.  Neurological: Negative for seizures, syncope and headaches.  Hematological: Negative for adenopathy. Does not bruise/bleed easily.  Psychiatric/Behavioral: Negative for confusion.  Blood pressure 112/70, pulse 68, temperature 98.6 F (37 C), temperature source Temporal, resp. rate 14, height 5' 2" (1.575 m), weight 151 lb 6.4 oz (68.675 kg).    Physical Exam  Physical Exam  WDWN in NAD  Lungs CTA B CV - RRR Abd - soft, nt Rectal - moderate-sized right external hemorrhoid - no sign of thrombosis or inflammation; very tender to palpation Small posterior midline anal fissure - almost healed Minimal internal hemorrhoid disease - very tender to digital rectal examination  Data Reviewed  none  Assessment Posterior midline anal fissure - almost healed Painful enlarged external hemorrhoid Hematochezia  Recommend examination under anesthesia and hemorrhoidectomy.  She has failed conservative therapy.  The surgical procedure has been discussed with the patient.  Potential risks,  benefits, alternative treatments, and expected outcomes have been explained.  All of the patient's questions at this time have been answered.  The likelihood of reaching the patient's treatment goal is good.  The patient understand the proposed surgical procedure and wishes to proceed.  Maghan Jessee K. Chenel Wernli, MD, FACS [facility]  11/14/2012 12:47 PM  

## 2012-11-18 NOTE — Anesthesia Preprocedure Evaluation (Signed)
Anesthesia Evaluation  Patient identified by MRN, date of birth, ID band Patient awake    Reviewed: Allergy & Precautions, H&P , NPO status , Patient's Chart, lab work & pertinent test results  Airway Mallampati: II  Neck ROM: full    Dental   Pulmonary          Cardiovascular + Valvular Problems/Murmurs  Mitral stenosis from rheumatic heart dz   Neuro/Psych    GI/Hepatic   Endo/Other    Renal/GU      Musculoskeletal   Abdominal   Peds  Hematology   Anesthesia Other Findings   Reproductive/Obstetrics                           Anesthesia Physical Anesthesia Plan  ASA: II  Anesthesia Plan: General   Post-op Pain Management:    Induction: Intravenous  Airway Management Planned: LMA  Additional Equipment:   Intra-op Plan:   Post-operative Plan:   Informed Consent: I have reviewed the patients History and Physical, chart, labs and discussed the procedure including the risks, benefits and alternatives for the proposed anesthesia with the patient or authorized representative who has indicated his/her understanding and acceptance.     Plan Discussed with: CRNA and Surgeon  Anesthesia Plan Comments:         Anesthesia Quick Evaluation

## 2012-11-18 NOTE — Anesthesia Postprocedure Evaluation (Signed)
Anesthesia Post Note  Patient: Megan Atkins  Procedure(s) Performed: Procedure(s) (LRB): HEMORRHOIDECTOMY (N/A)  Anesthesia type: General  Patient location: PACU  Post pain: Pain level controlled and Adequate analgesia  Post assessment: Post-op Vital signs reviewed, Patient's Cardiovascular Status Stable, Respiratory Function Stable, Patent Airway and Pain level controlled  Last Vitals:  Filed Vitals:   11/18/12 1045  BP: 98/59  Pulse: 83  Temp:   Resp: 11    Post vital signs: Reviewed and stable  Level of consciousness: awake, alert  and oriented  Complications: No apparent anesthesia complications

## 2012-11-18 NOTE — Anesthesia Procedure Notes (Signed)
Procedure Name: LMA Insertion Date/Time: 11/18/2012 9:25 AM Performed by: Burna Cash Pre-anesthesia Checklist: Patient identified, Emergency Drugs available, Suction available and Patient being monitored Patient Re-evaluated:Patient Re-evaluated prior to inductionOxygen Delivery Method: Circle System Utilized Preoxygenation: Pre-oxygenation with 100% oxygen Intubation Type: IV induction Ventilation: Mask ventilation without difficulty LMA: LMA inserted LMA Size: 4.0 Number of attempts: 1 Airway Equipment and Method: bite block Placement Confirmation: positive ETCO2 Tube secured with: Tape Dental Injury: Teeth and Oropharynx as per pre-operative assessment

## 2012-11-18 NOTE — Interval H&P Note (Signed)
History and Physical Interval Note:  11/18/2012 8:50 AM  Megan Atkins  has presented today for surgery, with the diagnosis of hemorrhoid  The various methods of treatment have been discussed with the patient and family. After consideration of risks, benefits and other options for treatment, the patient has consented to  Procedure(s): HEMORRHOIDECTOMY (N/A) as a surgical intervention .  The patient's history has been reviewed, patient examined, no change in status, stable for surgery.  I have reviewed the patient's chart and labs.  Questions were answered to the patient's satisfaction.     Rasmus Preusser K.

## 2012-11-18 NOTE — Op Note (Signed)
Pre-op diagnosis:  Bleeding painful hemorrhoids Postop diagnosis: Same Procedure performed hemorrhoidectomy (2 column) Surgeon:Mada Sadik K. Anesthesia: Gen. Indications: This is a 37 year old female who presents with an anterior and posterior midline enlarged external hemorrhoids with painful bleeding. She has tried conservative therapy with no improvement. She presents now for hemorrhoidectomy.  Description of procedure: The patient brought to the operating room and placed in a supine position on the operating room table. After an adequate level of general anesthesia was obtained her legs were placed in lithotomy position in yellowfin stirrups. Her perineum was prepped with Betadine and draped in sterile fashion. A timeout was taken to ensure the proper patient proper procedure. The sphincter was slowly dilated and the silver bullet retractor was inserted. The patient has a protruding posterior internal and external hemorrhoid with some obvious irritation. This was grasped with a Pennington clamp it was excised using the harmonic scalpel. There was good hemostasis. The edges of the mucosa and anoderm and then reapproximated with 3-0 Vicryl. We turned our attention to the anterior midline. There is an enlarged hemorrhoid located here. This was excised with the harmonic scalpel was closed with 3-0 Vicryl. 20 mL of Exparel was infiltrated into the intersphincteric groove.A dry dressing was applied. The patient was then extubated and brought to the recovery in stable condition. All sponge, initially, and needle counts are correct.  Wilmon Arms. Corliss Skains, MD, [facility]  11/18/2012 10:09 AM

## 2012-11-18 NOTE — Transfer of Care (Signed)
Immediate Anesthesia Transfer of Care Note  Patient: Megan Atkins  Procedure(s) Performed: Procedure(s): HEMORRHOIDECTOMY (N/A)  Patient Location: PACU  Anesthesia Type:General  Level of Consciousness: sedated  Airway & Oxygen Therapy: Patient Spontanous Breathing and Patient connected to face mask oxygen  Post-op Assessment: Report given to PACU RN and Post -op Vital signs reviewed and stable  Post vital signs: Reviewed and stable  Complications: No apparent anesthesia complications

## 2012-11-19 ENCOUNTER — Encounter (HOSPITAL_BASED_OUTPATIENT_CLINIC_OR_DEPARTMENT_OTHER): Payer: Self-pay | Admitting: Surgery

## 2012-11-28 ENCOUNTER — Encounter (INDEPENDENT_AMBULATORY_CARE_PROVIDER_SITE_OTHER): Payer: PRIVATE HEALTH INSURANCE | Admitting: General Surgery

## 2012-12-05 ENCOUNTER — Encounter: Payer: Self-pay | Admitting: Cardiovascular Disease

## 2012-12-05 ENCOUNTER — Ambulatory Visit (INDEPENDENT_AMBULATORY_CARE_PROVIDER_SITE_OTHER): Payer: No Typology Code available for payment source | Admitting: Cardiovascular Disease

## 2012-12-05 VITALS — BP 102/64 | HR 70 | Ht 62.0 in | Wt 150.2 lb

## 2012-12-05 DIAGNOSIS — R002 Palpitations: Secondary | ICD-10-CM | POA: Insufficient documentation

## 2012-12-05 DIAGNOSIS — I099 Rheumatic heart disease, unspecified: Secondary | ICD-10-CM

## 2012-12-05 NOTE — Patient Instructions (Addendum)
Your physician has requested that you have an echocardiogram. Echocardiography is a painless test that uses sound waves to create images of your heart. It provides your doctor with information about the size and shape of your heart and how well your heart's chambers and valves are working. This procedure takes approximately one hour. There are no restrictions for this procedure.  Continue taking your medications as prescribed  Follow-up with Dr. Eden Emms as needed.

## 2012-12-05 NOTE — Progress Notes (Signed)
Patient ID: Megan Atkins, female   DOB: 1976/08/28, 37 y.o.   MRN: [id number] 37 yo from [place] referred by Dr Gwendolyn Grant for history of RHD.  She has been here for 3 years. Initially on amiodarone, digoxen and atenolol. Former 2 d/c and palpitations have been infrequent.  Denies history of afib but all those meds were for "palpitations" Has not been on blood thinner and some issues with rectal bleeding.  She is active with no chest pain or exertional dyspnea  ROS: Denies fever, malais, weight loss, blurry vision, decreased visual acuity, cough, sputum, SOB, hemoptysis, pleuritic pain, palpitaitons, heartburn, abdominal pain, melena, lower extremity edema, claudication, or rash.  All other systems reviewed and negative   General: Affect appropriate Healthy:  appears stated age HEENT: normal Neck supple with no adenopathy JVP normal no bruits no thyromegaly Lungs clear with no wheezing and good diaphragmatic motion Heart:  S1 splict /S2 no murmur,rub, gallop or click PMI normal Abdomen: benighn, BS positve, no tenderness, no AAA no bruit.  No HSM or HJR Distal pulses intact with no bruits No edema Neuro non-focal Skin warm and dry No muscular weakness  Medications Current Outpatient Prescriptions  Medication Sig Dispense Refill  . aspirin 325 MG tablet Take 325 mg by mouth daily.      Marland Kitchen atenolol (TENORMIN) 25 MG tablet Take 25 mg by mouth daily.      Marland Kitchen oxyCODONE-acetaminophen (PERCOCET/ROXICET) 5-325 MG per tablet Take 1 tablet by mouth every 4 (four) hours as needed for pain.  40 tablet  0  . polyethylene glycol powder (GLYCOLAX/MIRALAX) powder Take 17 g by mouth 2 (two) times daily as needed.  3350 g  1  . Witch Hazel (TUCKS) 50 % PADS Use as needed for hemmerhoid pain/itching  40 each  3   No current facility-administered medications for this visit.    Allergies Review of patient's allergies indicates no known allergies.  Family History: No family history on file.  Social  History: History   Social History  . Marital Status: Married    Spouse Name: N/A    Number of Children: N/A  . Years of Education: N/A   Occupational History  . Not on file.   Social History Main Topics  . Smoking status: Never Smoker   . Smokeless tobacco: Never Used  . Alcohol Use: No  . Drug Use: No  . Sexually Active: Yes   Other Topics Concern  . Not on file   Social History Narrative   Moved here from [place] in 2010.   Diagnosed with Rheumatic heart disease prior to arrival     Electrocardiogram:  11/18/12  SR rate 69 RSR' no LAE  Assessment and Plan

## 2012-12-05 NOTE — Assessment & Plan Note (Signed)
Vague history  Split S1 and diastolic rumbles can be heard to hear  Echo to assess valves and atrial size

## 2012-12-05 NOTE — Assessment & Plan Note (Signed)
Given her med history suggests she has had PAF.  Infrequent palpitations now.  Echo to assess atria and MV.  NSR on ECG no indication for anticoagulation at this time Continue beta blocker

## 2012-12-09 ENCOUNTER — Encounter (INDEPENDENT_AMBULATORY_CARE_PROVIDER_SITE_OTHER): Payer: Self-pay | Admitting: Surgery

## 2012-12-09 ENCOUNTER — Ambulatory Visit (INDEPENDENT_AMBULATORY_CARE_PROVIDER_SITE_OTHER): Payer: PRIVATE HEALTH INSURANCE | Admitting: Surgery

## 2012-12-09 VITALS — BP 102/68 | HR 68 | Temp 98.0°F | Resp 16 | Ht 62.0 in | Wt 152.2 lb

## 2012-12-09 DIAGNOSIS — K644 Residual hemorrhoidal skin tags: Secondary | ICD-10-CM

## 2012-12-09 NOTE — Progress Notes (Signed)
Status post 2 column hemorrhoidectomy on 11/18/12. Initially she had significant pain that seems to be getting better. She is using MiraLAX when necessary for constipation. She continues with sitz baths.  On examination there are 2 tangling sutures which are removed. There is some granulation tissue at both suture lines. There is some mild swelling but this seems to be relatively small. There is some yellowish discharge but no sign of abscess.  Continue with sitz baths, MiraLAX, and baby wipes. I assured her that the swelling would resolve over the next few weeks. Recheck in 3-4 weeks.  Megan Atkins. Megan Skains, MD, [facility]  12/09/2012 4:22 PM

## 2012-12-09 NOTE — Patient Instructions (Addendum)
Continue with frequent sitz baths Miralax or stool softeners as needed to prevent constipation Baby wipes or Tucks pads after bowel movements.

## 2012-12-10 ENCOUNTER — Other Ambulatory Visit: Payer: Self-pay | Admitting: *Deleted

## 2012-12-10 DIAGNOSIS — R002 Palpitations: Secondary | ICD-10-CM

## 2012-12-12 ENCOUNTER — Other Ambulatory Visit (HOSPITAL_COMMUNITY): Payer: No Typology Code available for payment source

## 2012-12-26 ENCOUNTER — Ambulatory Visit (HOSPITAL_COMMUNITY): Payer: No Typology Code available for payment source | Attending: Cardiovascular Disease | Admitting: Radiology

## 2012-12-26 ENCOUNTER — Telehealth: Payer: Self-pay | Admitting: Family Medicine

## 2012-12-26 DIAGNOSIS — R011 Cardiac murmur, unspecified: Secondary | ICD-10-CM | POA: Insufficient documentation

## 2012-12-26 DIAGNOSIS — I099 Rheumatic heart disease, unspecified: Secondary | ICD-10-CM | POA: Insufficient documentation

## 2012-12-26 DIAGNOSIS — R002 Palpitations: Secondary | ICD-10-CM

## 2012-12-26 NOTE — Telephone Encounter (Signed)
Returned call re: husband.  See Ailene Ards telephone note.

## 2012-12-26 NOTE — Telephone Encounter (Signed)
Patient is returning call to Dr. Gwendolyn Grant

## 2012-12-26 NOTE — Progress Notes (Signed)
Echocardiogram performed.  

## 2013-01-06 ENCOUNTER — Encounter: Payer: Self-pay | Admitting: *Deleted

## 2013-01-09 ENCOUNTER — Telehealth: Payer: Self-pay | Admitting: Cardiovascular Disease

## 2013-01-09 ENCOUNTER — Encounter (INDEPENDENT_AMBULATORY_CARE_PROVIDER_SITE_OTHER): Payer: PRIVATE HEALTH INSURANCE | Admitting: Surgery

## 2013-01-09 NOTE — Telephone Encounter (Signed)
PT AWARE OF ECHO RESULTS./CY 

## 2013-01-09 NOTE — Telephone Encounter (Signed)
Follow Up   Following up on ECHO results. Would like to speak to nurse.

## 2013-01-15 ENCOUNTER — Encounter (INDEPENDENT_AMBULATORY_CARE_PROVIDER_SITE_OTHER): Payer: Self-pay | Admitting: Surgery

## 2013-01-15 ENCOUNTER — Ambulatory Visit (INDEPENDENT_AMBULATORY_CARE_PROVIDER_SITE_OTHER): Payer: PRIVATE HEALTH INSURANCE | Admitting: Surgery

## 2013-01-15 VITALS — BP 104/68 | HR 62 | Temp 97.7°F | Resp 12 | Ht 63.0 in | Wt 150.6 lb

## 2013-01-15 DIAGNOSIS — K644 Residual hemorrhoidal skin tags: Secondary | ICD-10-CM

## 2013-01-15 NOTE — Progress Notes (Signed)
Status post 2 column hemorrhoidectomy on 11/18/12. Initially she had significant pain that seems to be getting better. She is using MiraLAX when necessary for constipation.  On examination, the wounds are almost completely healed.  Minimal swelling noted.  Good sphincter tone.  No inflammation noted.  PRN Miralax.  Follow-up PRN.  Wilmon Arms. Corliss Skains, MD, [facility]  01/15/2013 4:04 PM

## 2013-02-13 ENCOUNTER — Ambulatory Visit: Payer: No Typology Code available for payment source | Admitting: Family Medicine

## 2013-03-10 ENCOUNTER — Telehealth: Payer: Self-pay | Admitting: *Deleted

## 2013-03-10 ENCOUNTER — Ambulatory Visit: Payer: No Typology Code available for payment source | Admitting: Family Medicine

## 2013-03-10 ENCOUNTER — Encounter: Payer: Self-pay | Admitting: Family Medicine

## 2013-03-10 ENCOUNTER — Ambulatory Visit (HOSPITAL_COMMUNITY)
Admission: RE | Admit: 2013-03-10 | Discharge: 2013-03-10 | Disposition: A | Discharge status: Home / Self Care | Payer: PRIVATE HEALTH INSURANCE | Source: Ambulatory Visit | Attending: Family Medicine | Admitting: Family Medicine

## 2013-03-10 ENCOUNTER — Ambulatory Visit (INDEPENDENT_AMBULATORY_CARE_PROVIDER_SITE_OTHER): Payer: PRIVATE HEALTH INSURANCE | Admitting: Family Medicine

## 2013-03-10 ENCOUNTER — Telehealth: Payer: Self-pay | Admitting: Family Medicine

## 2013-03-10 VITALS — BP 101/56 | HR 70 | Temp 98.3°F | Ht 62.0 in | Wt 146.0 lb

## 2013-03-10 DIAGNOSIS — R7611 Nonspecific reaction to tuberculin skin test without active tuberculosis: Secondary | ICD-10-CM

## 2013-03-10 DIAGNOSIS — D179 Benign lipomatous neoplasm, unspecified: Secondary | ICD-10-CM

## 2013-03-10 DIAGNOSIS — I5030 Unspecified diastolic (congestive) heart failure: Secondary | ICD-10-CM | POA: Insufficient documentation

## 2013-03-10 DIAGNOSIS — I5032 Chronic diastolic (congestive) heart failure: Secondary | ICD-10-CM

## 2013-03-10 DIAGNOSIS — M722 Plantar fascial fibromatosis: Secondary | ICD-10-CM

## 2013-03-10 NOTE — Assessment & Plan Note (Signed)
I have provided her with leg/foot exercises which help with this. Cervical lungs in the AVS which should be most helpful for her. I think she'll do well with inserts and some newer shoes as finances allow. I do not believe she can afford orthotics. She should followup in 2 weeks at that point she can call and make an appointment to schedule sports medicine if her pain is still present. Recommended ibuprofen for pain relief.

## 2013-03-10 NOTE — Telephone Encounter (Signed)
Left message on patients voicemail that letter and copy of normal x-ray was left up front. Megan Atkins, Virgel Bouquet

## 2013-03-10 NOTE — Telephone Encounter (Signed)
Left message on patients voicemail,letting her know letter and x-ray results were placed upfront for pick up. Roy Tokarz, Virgel Bouquet

## 2013-03-10 NOTE — Assessment & Plan Note (Signed)
CXR today for work.

## 2013-03-10 NOTE — Telephone Encounter (Signed)
Would you guys call and let her know the chest xray is negative and the letter is done?  Thanks, Trey Paula

## 2013-03-10 NOTE — Assessment & Plan Note (Signed)
Reassured patient regarding benign nature.

## 2013-03-10 NOTE — Patient Instructions (Addendum)
I have put in the order for your chest xray. Go to the  Hospital whenever is best for you.  No appointment needed.   Your foot is called plantar fascitis.   The best treatment for this is inserts for your shoes and pain medicine.  The leg bump is called a lipoma.  This is a fatty cyst.  It will probably not go away but is nothing bad.  If it starts to turn red or hurt, please let me know.    Plantar Fasciitis (Heel Spur Syndrome) with Rehab The plantar fascia is a fibrous, ligament-like, soft-tissue structure that spans the bottom of the foot. Plantar fasciitis is a condition that causes pain in the foot due to inflammation of the tissue. SYMPTOMS   Pain and tenderness on the underneath side of the foot.  Pain that worsens with standing or walking. CAUSES  Plantar fasciitis is caused by irritation and injury to the plantar fascia on the underneath side of the foot. Common mechanisms of injury include:  Direct trauma to bottom of the foot.  Damage to a small nerve that runs under the foot where the main fascia attaches to the heel bone.  Stress placed on the plantar fascia due to bone spurs. RISK INCREASES WITH:   Activities that place stress on the plantar fascia (running, jumping, pivoting, or cutting).  Poor strength and flexibility.  Improperly fitted shoes.  Tight calf muscles.  Flat feet.  Failure to warm-up properly before activity.  Obesity. PREVENTION  Warm up and stretch properly before activity.  Allow for adequate recovery between workouts.  Maintain physical fitness:  Strength, flexibility, and endurance.  Cardiovascular fitness.  Maintain a health body weight.  Avoid stress on the plantar fascia.  Wear properly fitted shoes, including arch supports for individuals who have flat feet. PROGNOSIS  If treated properly, then the symptoms of plantar fasciitis usually resolve without surgery. However, occasionally surgery is necessary. RELATED  COMPLICATIONS   Recurrent symptoms that may result in a chronic condition.  Problems of the lower back that are caused by compensating for the injury, such as limping.  Pain or weakness of the foot during push-off following surgery.  Chronic inflammation, scarring, and partial or complete fascia tear, occurring more often from repeated injections. TREATMENT  Treatment initially involves the use of ice and medication to help reduce pain and inflammation. The use of strengthening and stretching exercises may help reduce pain with activity, especially stretches of the Achilles tendon. These exercises may be performed at home or with a therapist. Your caregiver may recommend that you use heel cups of arch supports to help reduce stress on the plantar fascia. Occasionally, corticosteroid injections are given to reduce inflammation. If symptoms persist for greater than 6 months despite non-surgical (conservative), then surgery may be recommended.  MEDICATION   If pain medication is necessary, then nonsteroidal anti-inflammatory medications, such as aspirin and ibuprofen, or other minor pain relievers, such as acetaminophen, are often recommended.  Do not take pain medication within 7 days before surgery.  Prescription pain relievers may be given if deemed necessary by your caregiver. Use only as directed and only as much as you need.  Corticosteroid injections may be given by your caregiver. These injections should be reserved for the most serious cases, because they may only be given a certain number of times. HEAT AND COLD  Cold treatment (icing) relieves pain and reduces inflammation. Cold treatment should be applied for 10 to 15 minutes every 2 to  3 hours for inflammation and pain and immediately after any activity that aggravates your symptoms. Use ice packs or massage the area with a piece of ice (ice massage).  Heat treatment may be used prior to performing the stretching and strengthening  activities prescribed by your caregiver, physical therapist, or athletic trainer. Use a heat pack or soak the injury in warm water. SEEK IMMEDIATE MEDICAL CARE IF:  Treatment seems to offer no benefit, or the condition worsens.  Any medications produce adverse side effects. EXERCISES RANGE OF MOTION (ROM) AND STRETCHING EXERCISES - Plantar Fasciitis (Heel Spur Syndrome) These exercises may help you when beginning to rehabilitate your injury. Your symptoms may resolve with or without further involvement from your physician, physical therapist or athletic trainer. While completing these exercises, remember:   Restoring tissue flexibility helps normal motion to return to the joints. This allows healthier, less painful movement and activity.  An effective stretch should be held for at least 30 seconds.  A stretch should never be painful. You should only feel a gentle lengthening or release in the stretched tissue. RANGE OF MOTION - Toe Extension, Flexion  Sit with your right / left leg crossed over your opposite knee.  Grasp your toes and gently pull them back toward the top of your foot. You should feel a stretch on the bottom of your toes and/or foot.  Hold this stretch for __________ seconds.  Now, gently pull your toes toward the bottom of your foot. You should feel a stretch on the top of your toes and or foot.  Hold this stretch for __________ seconds. Repeat __________ times. Complete this stretch __________ times per day.  RANGE OF MOTION - Ankle Dorsiflexion, Active Assisted  Remove shoes and sit on a chair that is preferably not on a carpeted surface.  Place right / left foot under knee. Extend your opposite leg for support.  Keeping your heel down, slide your right / left foot back toward the chair until you feel a stretch at your ankle or calf. If you do not feel a stretch, slide your bottom forward to the edge of the chair, while still keeping your heel down.  Hold this  stretch for __________ seconds. Repeat __________ times. Complete this stretch __________ times per day.  STRETCH  Gastroc, Standing  Place hands on wall.  Extend right / left leg, keeping the front knee somewhat bent.  Slightly point your toes inward on your back foot.  Keeping your right / left heel on the floor and your knee straight, shift your weight toward the wall, not allowing your back to arch.  You should feel a gentle stretch in the right / left calf. Hold this position for __________ seconds. Repeat __________ times. Complete this stretch __________ times per day. STRETCH  Soleus, Standing  Place hands on wall.  Extend right / left leg, keeping the other knee somewhat bent.  Slightly point your toes inward on your back foot.  Keep your right / left heel on the floor, bend your back knee, and slightly shift your weight over the back leg so that you feel a gentle stretch deep in your back calf.  Hold this position for __________ seconds. Repeat __________ times. Complete this stretch __________ times per day. STRETCH  Gastrocsoleus, Standing  Note: This exercise can place a lot of stress on your foot and ankle. Please complete this exercise only if specifically instructed by your caregiver.   Place the ball of your right / left  foot on a step, keeping your other foot firmly on the same step.  Hold on to the wall or a rail for balance.  Slowly lift your other foot, allowing your body weight to press your heel down over the edge of the step.  You should feel a stretch in your right / left calf.  Hold this position for __________ seconds.  Repeat this exercise with a slight bend in your right / left knee. Repeat __________ times. Complete this stretch __________ times per day.  STRENGTHENING EXERCISES - Plantar Fasciitis (Heel Spur Syndrome)  These exercises may help you when beginning to rehabilitate your injury. They may resolve your symptoms with or without further  involvement from your physician, physical therapist or athletic trainer. While completing these exercises, remember:   Muscles can gain both the endurance and the strength needed for everyday activities through controlled exercises.  Complete these exercises as instructed by your physician, physical therapist or athletic trainer. Progress the resistance and repetitions only as guided. STRENGTH - Towel Curls  Sit in a chair positioned on a non-carpeted surface.  Place your foot on a towel, keeping your heel on the floor.  Pull the towel toward your heel by only curling your toes. Keep your heel on the floor.  If instructed by your physician, physical therapist or athletic trainer, add ____________________ at the end of the towel. Repeat __________ times. Complete this exercise __________ times per day. STRENGTH - Ankle Inversion  Secure one end of a rubber exercise band/tubing to a fixed object (table, pole). Loop the other end around your foot just before your toes.  Place your fists between your knees. This will focus your strengthening at your ankle.  Slowly, pull your big toe up and in, making sure the band/tubing is positioned to resist the entire motion.  Hold this position for __________ seconds.  Have your muscles resist the band/tubing as it slowly pulls your foot back to the starting position. Repeat __________ times. Complete this exercises __________ times per day.  Document Released: 09/24/2005 Document Revised: 12/17/2011 Document Reviewed: 01/06/2009 [facility] Patient Information 2014 [place], [place].

## 2013-03-10 NOTE — Progress Notes (Signed)
Subjective:    Megan Atkins is a 38 y.o. female who presents to [facility] today with several concerns:  1.  Right heel pain: present for the past 4-6 weeks. Patient notes that she stands for long periods of time or is walking she has pain in her heel. This is the on time she is pain. She denies any traumas or inciting incidents. She wears tennis shoes mostly but does state that most of her shoes are very old. No swelling to her feet. She has not tried anything for relief.  #2. Needs a chest x-ray for work:  Patient is an immigrant from [place]. She previously had BCG vaccine. She therefore cannot have a PPD placed because it always large. 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 No night sweats, fevers, chills. No hemoptysis. No weight loss.  #3. "Bump" on her GNF:AOZHYQM has noted a "knot" underneath her right eye. She will like to have this checked out. It is not painful. She is unclear how long it has been there.  #4. Patient would like her daughter to be followed here. I have taken at her daughter's name and we'll have talk with the front office staff so that she can also be my patient. Evidently her daughter is 60 years old but does not have a PCP.   The following portions of the patient's history were reviewed and updated as appropriate: allergies, current medications, past medical history, family and social history, and problem list. Patient is a nonsmoker.    PMH reviewed.  Past Medical History  Diagnosis Date  . Rheumatic heart disease   . Hemorrhoid 11/2012  . Palpitations   . Mitral stenosis     last echo 05/2011 at [facility]   Past Surgical History  Procedure Laterality Date  . No past surgeries    . Hemorrhoid surgery N/A 11/18/2012    Procedure: HEMORRHOIDECTOMY;  Surgeon: Wilmon Arms. Corliss Skains, MD;  Location: [facility];  Service: General;  Laterality: N/A;    Medications reviewed. Current Outpatient Prescriptions   Medication Sig Dispense Refill  . aspirin 325 MG tablet Take 325 mg by mouth daily.      Marland Kitchen atenolol (TENORMIN) 25 MG tablet Take 25 mg by mouth daily.      . polyethylene glycol powder (GLYCOLAX/MIRALAX) powder Take 17 g by mouth 2 (two) times daily as needed.  3350 g  1  . Witch Hazel (TUCKS) 50 % PADS Use as needed for hemmerhoid pain/itching  40 each  3   No current facility-administered medications for this visit.    ROS as above otherwise neg.  No chest pain, palpitations, SOB, Fever, Chills, Abd pain, N/V/D.   Objective:   Physical Exam BP 101/56  Pulse 70  Temp(Src) 98.3 F (36.8 C) (Oral)  Ht 5\' 2"  (1.575 m)  Wt 146 lb (66.225 kg)  BMI 26.7 kg/m2  LMP 02/18/2013 Gen:  Alert, cooperative patient who appears stated age in no acute distress.  Vital signs reviewed. HEENT: EOMI,  MMM Cardiac:  Regular rate and rhythm. DP pulses are +2 bilaterally Pulm:  Clear to auscultation bilaterally with good air movement.  No wheezes or rales noted.   Exts: no swelling bilaterally. Skin: I note a 1 cm in diameter rubbery nodule consistent with lipoma located medial aspect of proximal thigh just a few centimeters inferior to the inguinal crease. This is nontender. No overlying redness, Musculoskeletal: Left foot within normal limits. Right  foot exam:  Pain directly or plantar fascia connects with healed. No tenderness of her Achilles. No tenderness of her calcaneus itself. She has no arch pain. When she stands I do appreciate somewhat flattened arches bilaterally.strength is 5 out of 5. Sensation intact.   No results found for this or any previous visit (from the past 72 hour(s)).

## 2013-06-05 ENCOUNTER — Other Ambulatory Visit: Payer: Self-pay | Admitting: *Deleted

## 2013-06-09 MED ORDER — ATENOLOL 25 MG PO TABS: 25.0000 mg | ORAL_TABLET | Freq: Every day | ORAL | Status: DC

## 2013-06-10 ENCOUNTER — Telehealth: Payer: Self-pay | Admitting: *Deleted

## 2013-06-10 ENCOUNTER — Other Ambulatory Visit: Payer: Self-pay | Admitting: *Deleted

## 2013-06-10 MED ORDER — ATENOLOL 25 MG PO TABS: 25.0000 mg | ORAL_TABLET | Freq: Every day | ORAL | Status: DC

## 2013-06-10 NOTE — Telephone Encounter (Signed)
refill 

## 2013-08-25 ENCOUNTER — Ambulatory Visit: Payer: Self-pay

## 2014-05-20 ENCOUNTER — Ambulatory Visit: Payer: Self-pay | Admitting: Family Medicine

## 2014-06-22 ENCOUNTER — Other Ambulatory Visit: Payer: Self-pay | Admitting: Cardiovascular Disease

## 2014-07-15 ENCOUNTER — Encounter: Payer: Self-pay | Admitting: Family Medicine

## 2014-07-15 ENCOUNTER — Ambulatory Visit (INDEPENDENT_AMBULATORY_CARE_PROVIDER_SITE_OTHER): Payer: No Typology Code available for payment source | Admitting: Family Medicine

## 2014-07-15 VITALS — BP 97/50 | HR 63 | Temp 98.2°F | Ht 62.0 in | Wt 137.6 lb

## 2014-07-15 DIAGNOSIS — M722 Plantar fascial fibromatosis: Secondary | ICD-10-CM

## 2014-07-15 MED ORDER — MELOXICAM 15 MG PO TABS: 15.0000 mg | ORAL_TABLET | Freq: Every day | ORAL | Status: DC

## 2014-07-15 NOTE — Patient Instructions (Signed)
Plantar Fasciitis (Heel Spur Syndrome) with Rehab The plantar fascia is a fibrous, ligament-like, soft-tissue structure that spans the bottom of the foot. Plantar fasciitis is a condition that causes pain in the foot due to inflammation of the tissue. SYMPTOMS   Pain and tenderness on the underneath side of the foot.  Pain that worsens with standing or walking. CAUSES  Plantar fasciitis is caused by irritation and injury to the plantar fascia on the underneath side of the foot. Common mechanisms of injury include:  Direct trauma to bottom of the foot.  Damage to a small nerve that runs under the foot where the main fascia attaches to the heel bone.  Stress placed on the plantar fascia due to bone spurs. RISK INCREASES WITH:   Activities that place stress on the plantar fascia (running, jumping, pivoting, or cutting).  Poor strength and flexibility.  Improperly fitted shoes.  Tight calf muscles.  Flat feet.  Failure to warm-up properly before activity.  Obesity. PREVENTION  Warm up and stretch properly before activity.  Allow for adequate recovery between workouts.  Maintain physical fitness:  Strength, flexibility, and endurance.  Cardiovascular fitness.  Maintain a health body weight.  Avoid stress on the plantar fascia.  Wear properly fitted shoes, including arch supports for individuals who have flat feet. PROGNOSIS  If treated properly, then the symptoms of plantar fasciitis usually resolve without surgery. However, occasionally surgery is necessary. RELATED COMPLICATIONS   Recurrent symptoms that may result in a chronic condition.  Problems of the lower back that are caused by compensating for the injury, such as limping.  Pain or weakness of the foot during push-off following surgery.  Chronic inflammation, scarring, and partial or complete fascia tear, occurring more often from repeated injections. TREATMENT  Treatment initially involves the use of  ice and medication to help reduce pain and inflammation. The use of strengthening and stretching exercises may help reduce pain with activity, especially stretches of the Achilles tendon. These exercises may be performed at home or with a therapist. Your caregiver may recommend that you use heel cups of arch supports to help reduce stress on the plantar fascia. Occasionally, corticosteroid injections are given to reduce inflammation. If symptoms persist for greater than 6 months despite non-surgical (conservative), then surgery may be recommended.  MEDICATION   If pain medication is necessary, then nonsteroidal anti-inflammatory medications, such as aspirin and ibuprofen, or other minor pain relievers, such as acetaminophen, are often recommended.  Do not take pain medication within 7 days before surgery.  Prescription pain relievers may be given if deemed necessary by your caregiver. Use only as directed and only as much as you need.  Corticosteroid injections may be given by your caregiver. These injections should be reserved for the most serious cases, because they may only be given a certain number of times. HEAT AND COLD  Cold treatment (icing) relieves pain and reduces inflammation. Cold treatment should be applied for 10 to 15 minutes every 2 to 3 hours for inflammation and pain and immediately after any activity that aggravates your symptoms. Use ice packs or massage the area with a piece of ice (ice massage).  Heat treatment may be used prior to performing the stretching and strengthening activities prescribed by your caregiver, physical therapist, or athletic trainer. Use a heat pack or soak the injury in warm water. SEEK IMMEDIATE MEDICAL CARE IF:  Treatment seems to offer no benefit, or the condition worsens.  Any medications produce adverse side effects. EXERCISES RANGE   OF MOTION (ROM) AND STRETCHING EXERCISES - Plantar Fasciitis (Heel Spur Syndrome) These exercises may help you  when beginning to rehabilitate your injury. Your symptoms may resolve with or without further involvement from your physician, physical therapist or athletic trainer. While completing these exercises, remember:   Restoring tissue flexibility helps normal motion to return to the joints. This allows healthier, less painful movement and activity.  An effective stretch should be held for at least 30 seconds.  A stretch should never be painful. You should only feel a gentle lengthening or release in the stretched tissue. RANGE OF MOTION - Toe Extension, Flexion  Sit with your right / left leg crossed over your opposite knee.  Grasp your toes and gently pull them back toward the top of your foot. You should feel a stretch on the bottom of your toes and/or foot.  Hold this stretch for __________ seconds.  Now, gently pull your toes toward the bottom of your foot. You should feel a stretch on the top of your toes and or foot.  Hold this stretch for __________ seconds. Repeat __________ times. Complete this stretch __________ times per day.  RANGE OF MOTION - Ankle Dorsiflexion, Active Assisted  Remove shoes and sit on a chair that is preferably not on a carpeted surface.  Place right / left foot under knee. Extend your opposite leg for support.  Keeping your heel down, slide your right / left foot back toward the chair until you feel a stretch at your ankle or calf. If you do not feel a stretch, slide your bottom forward to the edge of the chair, while still keeping your heel down.  Hold this stretch for __________ seconds. Repeat __________ times. Complete this stretch __________ times per day.  STRETCH - Gastroc, Standing  Place hands on wall.  Extend right / left leg, keeping the front knee somewhat bent.  Slightly point your toes inward on your back foot.  Keeping your right / left heel on the floor and your knee straight, shift your weight toward the wall, not allowing your back to  arch.  You should feel a gentle stretch in the right / left calf. Hold this position for __________ seconds. Repeat __________ times. Complete this stretch __________ times per day. STRETCH - Soleus, Standing  Place hands on wall.  Extend right / left leg, keeping the other knee somewhat bent.  Slightly point your toes inward on your back foot.  Keep your right / left heel on the floor, bend your back knee, and slightly shift your weight over the back leg so that you feel a gentle stretch deep in your back calf.  Hold this position for __________ seconds. Repeat __________ times. Complete this stretch __________ times per day. STRETCH - Gastrocsoleus, Standing  Note: This exercise can place a lot of stress on your foot and ankle. Please complete this exercise only if specifically instructed by your caregiver.   Place the ball of your right / left foot on a step, keeping your other foot firmly on the same step.  Hold on to the wall or a rail for balance.  Slowly lift your other foot, allowing your body weight to press your heel down over the edge of the step.  You should feel a stretch in your right / left calf.  Hold this position for __________ seconds.  Repeat this exercise with a slight bend in your right / left knee. Repeat __________ times. Complete this stretch __________ times per day.    STRENGTHENING EXERCISES - Plantar Fasciitis (Heel Spur Syndrome)  These exercises may help you when beginning to rehabilitate your injury. They may resolve your symptoms with or without further involvement from your physician, physical therapist or athletic trainer. While completing these exercises, remember:   Muscles can gain both the endurance and the strength needed for everyday activities through controlled exercises.  Complete these exercises as instructed by your physician, physical therapist or athletic trainer. Progress the resistance and repetitions only as guided. STRENGTH -  Towel Curls  Sit in a chair positioned on a non-carpeted surface.  Place your foot on a towel, keeping your heel on the floor.  Pull the towel toward your heel by only curling your toes. Keep your heel on the floor.  If instructed by your physician, physical therapist or athletic trainer, add ____________________ at the end of the towel. Repeat __________ times. Complete this exercise __________ times per day. STRENGTH - Ankle Inversion  Secure one end of a rubber exercise band/tubing to a fixed object (table, pole). Loop the other end around your foot just before your toes.  Place your fists between your knees. This will focus your strengthening at your ankle.  Slowly, pull your big toe up and in, making sure the band/tubing is positioned to resist the entire motion.  Hold this position for __________ seconds.  Have your muscles resist the band/tubing as it slowly pulls your foot back to the starting position. Repeat __________ times. Complete this exercises __________ times per day.  Document Released: 09/24/2005 Document Revised: 12/17/2011 Document Reviewed: 01/06/2009 [employer] Patient Information 2015 [employer]. This information is not intended to replace advice given to you by your health care provider. Make sure you discuss any questions you have with your health care provider.  

## 2014-07-16 NOTE — Progress Notes (Signed)
   Subjective:    Patient ID: Megan Atkins, female    DOB: [birth year], 38 y.o.   MRN: [id number]  HPI 38 year old female presents for same day appointment with complaints of heel pain.  1) Heel Pain  Patient reports that her heel pain has been bothering her for approximately one year.  She reports that her pain is located in both heels, particularly worse on the right.  She reports pain is worse first thing in the morning.   She has a known history of plantar fasciitis.  No relieving factors. She's not currently using any medications.  She does report that she uses heel cup/supports.  Patient also reports that she has some associated right ankle pain.  Review of Systems Per HPI with the following additions: Patient reports recent history of gum bleeding    Objective:   Physical Exam Filed Vitals:   07/15/14 1448  BP: 97/50  Pulse: 63  Temp: 98.2 F (36.8 C)   Exam: General: well appearing female in no acute distress.  MSK: Feet (bilateral). Normal inspection with no visable or palpable fat pad atrophy and no visible swelling/erythema. Patient is tender at medial insertion of plantar fascia into calcaneus. Arch shape: Flat longitudinal arches.  MSK: Ankle (R) No visible erythema or swelling. Range of motion is full in all directions. Strength is 5/5 in all directions. No tenderness to palpation.    Assessment & Plan:  See Problem List.

## 2014-07-16 NOTE — Assessment & Plan Note (Signed)
History physical exam consistent with plantar fasciitis. Once again, advised stretching exercises (handout given), good shoe support and heel cups. I also gave patient prescription for [facility] for pain/inflammation. If no improvement with conservative measures, will need injection.

## 2014-08-26 ENCOUNTER — Other Ambulatory Visit: Payer: Self-pay | Admitting: Family Medicine

## 2014-10-28 ENCOUNTER — Other Ambulatory Visit: Payer: Self-pay | Admitting: Family Medicine

## 2014-12-03 ENCOUNTER — Other Ambulatory Visit: Payer: Self-pay | Admitting: Family Medicine

## 2015-01-03 ENCOUNTER — Other Ambulatory Visit: Payer: Self-pay | Admitting: Family Medicine

## 2015-07-30 ENCOUNTER — Other Ambulatory Visit: Payer: Self-pay | Admitting: Cardiovascular Disease

## 2015-08-01 ENCOUNTER — Other Ambulatory Visit: Payer: Self-pay | Admitting: Cardiovascular Disease

## 2015-08-02 ENCOUNTER — Other Ambulatory Visit: Payer: Self-pay

## 2015-08-02 MED ORDER — ATENOLOL 50 MG PO TABS: 50.0000 mg | ORAL_TABLET | Freq: Every day | ORAL | Status: DC

## 2015-09-08 ENCOUNTER — Encounter: Payer: Self-pay | Admitting: *Deleted

## 2015-09-08 NOTE — Progress Notes (Signed)
Patient ID: Megan Atkins, female   DOB: 10-21-1975, 39 y.o.   MRN: [id number]   39 y.o. from [place] referred by Dr Mingo Amber 2014  for history of RHD.  She has been here for 5 years. Initially on amiodarone, digoxen and atenolol. Former 2 d/c and palpitations have been infrequent.  Denies history of afib but all those meds were for "palpitations" Has not been on blood thinner and some issues with rectal bleeding.  She is active with no chest pain or exertional dyspnea  ROS: Denies fever, malais, weight loss, blurry vision, decreased visual acuity, cough, sputum, SOB, hemoptysis, pleuritic pain, palpitaitons, heartburn, abdominal pain, melena, lower extremity edema, claudication, or rash.  All other systems reviewed and negative  Echo 12/26/12  Reviewed:  Moderate MS  Study Conclusions  - Left ventricle: The cavity size was normal. Wall thickness was normal. Systolic function was normal. The estimated ejection fraction was in the range of 55% to 60%. Wall motion was normal; there were no regional wall motion abnormalities. Doppler parameters are consistent with abnormal left ventricular relaxation (grade 1 diastolic dysfunction). Doppler parameters are consistent with high ventricular filling pressure. - Mitral valve: Mild thickening, consistent with rheumatic disease. Leaflet separation was reduced. Mobility was restricted. The findings are consistent with moderate stenosis. Mild regurgitation. Mean gradient: 65mm Hg (D). Peak gradient: 58mm Hg (D). Planimetered valve area: 1.42cm^2. Valve area by pressure half-time: 1.24cm^2. Valve area by continuity equation (using LVOT flow): 1.05cm^2. Valve area: 1.27cm^2 (PISA). - Left atrium: The atrium was moderately dilated. - Pulmonary arteries: PA peak pressure: 50mm Hg (S).  General: Affect appropriate Healthy:  appears stated age 38: normal Neck supple with no adenopathy JVP normal no bruits no thyromegaly Lungs  clear with no wheezing and good diaphragmatic motion Heart:  S1 splict /S2 no murmur,rub, gallop or click PMI normal Abdomen: benighn, BS positve, no tenderness, no AAA no bruit.  No HSM or HJR Distal pulses intact with no bruits No edema Neuro non-focal Skin warm and dry No muscular weakness  Medications Current Outpatient Prescriptions  Medication Sig Dispense Refill  . atenolol (TENORMIN) 50 MG tablet Take 1 tablet (50 mg total) by mouth daily. 30 tablet 1   No current facility-administered medications for this visit.    Allergies Review of patient's allergies indicates no known allergies.  Family History: Family History  Problem Relation Age of Onset  . Family history unknown: Yes    Social History: Social History   Social History  . Marital Status: Married    Spouse Name: N/A  . Number of Children: N/A  . Years of Education: N/A   Occupational History  . Not on file.   Social History Main Topics  . Smoking status: Never Smoker   . Smokeless tobacco: Never Used  . Alcohol Use: No  . Drug Use: No  . Sexual Activity: Yes   Other Topics Concern  . Not on file   Social History Narrative   Moved here from [place] in 2010.   Diagnosed with Rheumatic heart disease prior to arrival     Electrocardiogram:  11/18/12  SR rate 69 RSR' no LAE  Assessment and Plan Mitral Stenosis: no change in murmur on beta blocker to maximize diastolic filling time f/u echo to reassess since it has been 2.5 years Palpitations: She refused ECG today rhythm regular in office today no signs of afib   Refill Atenolol Echo this week  She is concerned that her insurance runs out on 12/15  Jenkins Rouge

## 2015-09-12 ENCOUNTER — Ambulatory Visit (INDEPENDENT_AMBULATORY_CARE_PROVIDER_SITE_OTHER): Payer: No Typology Code available for payment source | Admitting: Cardiovascular Disease

## 2015-09-12 ENCOUNTER — Encounter: Payer: Self-pay | Admitting: Cardiovascular Disease

## 2015-09-12 VITALS — BP 94/60 | HR 71 | Ht 63.0 in | Wt 143.4 lb

## 2015-09-12 DIAGNOSIS — I050 Rheumatic mitral stenosis: Secondary | ICD-10-CM

## 2015-09-12 DIAGNOSIS — I099 Rheumatic heart disease, unspecified: Secondary | ICD-10-CM | POA: Diagnosis not present

## 2015-09-12 MED ORDER — ATENOLOL 50 MG PO TABS: 50.0000 mg | ORAL_TABLET | Freq: Every day | ORAL | Status: DC

## 2015-09-12 NOTE — Patient Instructions (Addendum)
Medication Instructions:  Your physician recommends that you continue on your current medications as directed. Please refer to the Current Medication list given to you today.  Labwork: NONE  Testing/Procedures: Your physician has requested that you have an echocardiogram Thursday or Friday of this week. Echocardiography is a painless test that uses sound waves to create images of your heart. It provides your doctor with information about the size and shape of your heart and how well your heart's chambers and valves are working. This procedure takes approximately one hour. There are no restrictions for this procedure.    Follow-Up: Your physician wants you to follow-up in: 12 month follow-up. You will receive a reminder letter in the mail two months in advance. If you don't receive a letter, please call our office to schedule the follow-up appointment.   If you need a refill on your cardiac medications before your next appointment, please call your pharmacy.

## 2015-09-13 ENCOUNTER — Ambulatory Visit (INDEPENDENT_AMBULATORY_CARE_PROVIDER_SITE_OTHER): Payer: No Typology Code available for payment source | Admitting: Family Medicine

## 2015-09-13 ENCOUNTER — Encounter: Payer: Self-pay | Admitting: Family Medicine

## 2015-09-13 VITALS — BP 100/53 | HR 73 | Temp 98.3°F | Ht 63.0 in | Wt 139.6 lb

## 2015-09-13 DIAGNOSIS — Z0000 Encounter for general adult medical examination without abnormal findings: Secondary | ICD-10-CM | POA: Diagnosis not present

## 2015-09-13 DIAGNOSIS — R21 Rash and other nonspecific skin eruption: Secondary | ICD-10-CM

## 2015-09-13 DIAGNOSIS — R002 Palpitations: Secondary | ICD-10-CM | POA: Diagnosis not present

## 2015-09-13 DIAGNOSIS — M722 Plantar fascial fibromatosis: Secondary | ICD-10-CM

## 2015-09-13 MED ORDER — PERMETHRIN 5 % EX CREA: 1.0000 "application " | TOPICAL_CREAM | Freq: Once | CUTANEOUS | Status: DC

## 2015-09-13 MED ORDER — TRIAMCINOLONE ACETONIDE 0.1 % EX CREA: 1.0000 "application " | TOPICAL_CREAM | Freq: Two times a day (BID) | CUTANEOUS | Status: DC

## 2015-09-13 NOTE — Assessment & Plan Note (Signed)
There is a slight chance this may be scabies especially as her daughter had similar symptoms. However this is not found on the rest of her body. We'll treat with permethrin just to be sure. Otherwise appears to be allergic reaction, unknown etiology. Treat with triamcinolone cream.

## 2015-09-13 NOTE — Assessment & Plan Note (Signed)
She currently declines any health maintenance screening, including labs, as she doesn't want to pay for them out of pocket and would rather wait for her new insurance.

## 2015-09-13 NOTE — Assessment & Plan Note (Signed)
Controlled currently with exercises. She will call back if pain worsens. Declines any further oral analgesics currently.

## 2015-09-13 NOTE — Progress Notes (Signed)
Subjective:    Megan Atkins is a 39 y.o. female who presents to [facility] today for several issues:  1.  Rash:  Started about 2 months ago. Describes as redness along left knee and lower calf. Her daughter had similar rash with this resolved on its own. Worse at night. She is tried over-the-counter hydrocortisone cream without relief. No recent illnesses.  2.  Right foot pain:  She was previously seen last year and diagnoses plantar fasciitis. She was prescribed Mobic at that time. Currently she still awakens some mornings with pain. However she does daily foot exercises and ankle strengthening. Her pain is much better. She has not had any further need for Mobic currently.   Prev health:  Currently overdue for tetanus, flu shot, pap smear. She declined all of this today.    ROS as above per HPI, otherwise neg.    The following portions of the patient's history were reviewed and updated as appropriate: allergies, current medications, past medical history, family and social history, and problem list. Patient is a nonsmoker.    PMH reviewed.  Past Medical History  Diagnosis Date  . Rheumatic heart disease   . Hemorrhoid 11/2012  . Palpitations   . Mitral stenosis     last echo 05/2011 at [facility]   Past Surgical History  Procedure Laterality Date  . No past surgeries    . Hemorrhoid surgery N/A 11/18/2012    Procedure: HEMORRHOIDECTOMY;  Surgeon: Imogene Burn. Georgette Dover, MD;  Location: [place];  Service: General;  Laterality: N/A;    Medications reviewed. Current Outpatient Prescriptions  Medication Sig Dispense Refill  . atenolol (TENORMIN) 50 MG tablet Take 1 tablet (50 mg total) by mouth daily. 30 tablet 11   No current facility-administered medications for this visit.     Objective:   Physical Exam BP 100/53 mmHg  Pulse 73  Temp(Src) 98.3 F (36.8 C) (Oral)  Ht 5\' 3"  (1.6 m)  Wt 139 lb 9.6 oz (63.322 kg)  BMI 24.74 kg/m2  LMP 09/02/2015 Gen:  Alert,  cooperative patient who appears stated age in no acute distress.  Vital signs reviewed. Skin: Erythematous confluent maculopapular rash extends from just above left knee to mid shin. Excoriations noted.  No results found for this or any previous visit (from the past 72 hour(s)).

## 2015-09-13 NOTE — Assessment & Plan Note (Signed)
Still with occasional palpitations. 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 She is attempting to look for a new job. -She runs out of her insurance on December 15 and we will refer her for health navigation for new insurance.

## 2015-09-13 NOTE — Patient Instructions (Signed)
There will be 2 creams - Permethrin is the first.  Use this at night, keep it on, and wash it off in the AM.   Use the Triamcinolone cream twice a day to help with the itching and allergy.  Use for about 2 weeks.  If the itching is really bad try Benadryl pills over the counter.    Keep doing the exercises for your foot.    We will have some contact you about insurance.  I will let you know about any jobs.  Come back once the insurance is taken care of and we'll do the pap smear and other lab checks.

## 2015-09-16 ENCOUNTER — Ambulatory Visit (HOSPITAL_COMMUNITY)
Admission: RE | Admit: 2015-09-16 | Discharge: 2015-09-16 | Disposition: A | Discharge status: Home / Self Care | Payer: No Typology Code available for payment source | Source: Ambulatory Visit | Attending: Cardiovascular Disease | Admitting: Cardiovascular Disease

## 2015-09-16 DIAGNOSIS — I050 Rheumatic mitral stenosis: Secondary | ICD-10-CM | POA: Diagnosis not present

## 2015-09-16 DIAGNOSIS — I517 Cardiomegaly: Secondary | ICD-10-CM | POA: Insufficient documentation

## 2015-09-16 DIAGNOSIS — I059 Rheumatic mitral valve disease, unspecified: Secondary | ICD-10-CM | POA: Diagnosis not present

## 2015-09-16 DIAGNOSIS — I340 Nonrheumatic mitral (valve) insufficiency: Secondary | ICD-10-CM | POA: Insufficient documentation

## 2015-09-16 NOTE — Progress Notes (Signed)
  Echocardiogram 2D Echocardiogram has been performed.  Bobbye Charleston 09/16/2015, 9:46 AM

## 2015-10-18 ENCOUNTER — Telehealth: Payer: Self-pay | Admitting: *Deleted

## 2015-10-18 NOTE — Telephone Encounter (Signed)
Called patient to offer flu vaccine. Scheduled patient to receive flu vaccine on 10/27/2015 at 0900. Velora Heckler, RN

## 2015-10-27 ENCOUNTER — Ambulatory Visit: Payer: No Typology Code available for payment source

## 2015-12-01 ENCOUNTER — Ambulatory Visit (INDEPENDENT_AMBULATORY_CARE_PROVIDER_SITE_OTHER): Payer: BLUE CROSS/BLUE SHIELD | Admitting: Family Medicine

## 2015-12-01 ENCOUNTER — Encounter: Payer: Self-pay | Admitting: Family Medicine

## 2015-12-01 VITALS — BP 98/63 | HR 65 | Temp 97.4°F | Wt 140.0 lb

## 2015-12-01 DIAGNOSIS — M26629 Arthralgia of temporomandibular joint, unspecified side: Secondary | ICD-10-CM | POA: Diagnosis not present

## 2015-12-01 LAB — COMPLETE METABOLIC PANEL WITH GFR
ALT: 14 U/L (ref 6–29)
AST: 14 U/L (ref 10–30)
Albumin: 4.2 g/dL (ref 3.6–5.1)
Alkaline Phosphatase: 45 U/L (ref 33–115)
BUN: 7 mg/dL (ref 7–25)
CO2: 26 mmol/L (ref 20–31)
Calcium: 8.9 mg/dL (ref 8.6–10.2)
Chloride: 107 mmol/L (ref 98–110)
Creat: 0.44 mg/dL — ABNORMAL LOW (ref 0.50–1.10)
GFR, Est African American: >89 mL/min (ref >=60)
GLUCOSE: 78 mg/dL (ref 65–99)
POTASSIUM: 4.1 mmol/L (ref 3.5–5.3)
SODIUM: 141 mmol/L (ref 135–146)
TOTAL PROTEIN: 6.7 g/dL (ref 6.1–8.1)
Total Bilirubin: 1.7 mg/dL — ABNORMAL HIGH (ref 0.2–1.2)

## 2015-12-01 LAB — CBC
HCT: 36.7 % (ref 36.0–46.0)
Hemoglobin: 13.1 g/dL (ref 12.0–15.0)
MCH: 29.1 pg (ref 26.0–34.0)
MCHC: 35.7 g/dL (ref 30.0–36.0)
MCV: 81.6 fL (ref 78.0–100.0)
MPV: 10.7 fL (ref 8.6–12.4)
PLATELETS: 219 10*3/uL (ref 150–400)
RBC: 4.5 MIL/uL (ref 3.87–5.11)
RDW: 13.1 % (ref 11.5–15.5)
WBC: 5.6 10*3/uL (ref 4.0–10.5)

## 2015-12-01 MED ORDER — NAPROXEN 500 MG PO TABS: 500.0000 mg | ORAL_TABLET | Freq: Two times a day (BID) | ORAL | Status: DC

## 2015-12-01 NOTE — Patient Instructions (Addendum)
Her symptoms are consistent with TMJ ( temporal mandibular joint disorder ), which may be related to your recent increase in stress thank you be associated with teeth grinding.    Take naproxen 500 mg twice a day for 1 week, If your symptoms continue after that then continue taking naproxen 500 mg twice a day for his second week  Return to clinic for reevaluation, if your symptoms persist beyond 2 weeks  Return to clinic sooner if your pain worsens is unrelieved by naproxen or you develop new or concerning symptoms   Temporomandibular Joint Syndrome Temporomandibular joint (TMJ) syndrome is a condition that affects the joints between your jaw and your skull. The TMJs are located near your ears and allow your jaw to open and close. These joints and the nearby muscles are involved in all movements of the jaw. People with TMJ syndrome have pain in the area of these joints and muscles. Chewing, biting, or other movements of the jaw can be difficult or painful. TMJ syndrome can be caused by various things. In many cases, the condition is mild and goes away within a few weeks. For some people, the condition can become a long-term problem. CAUSES Possible causes of TMJ syndrome include:  Grinding your teeth or clenching your jaw. Some people do this when they are under stress.  Arthritis.  Injury to the jaw.  Head or neck injury.  Teeth or dentures that are not aligned well. In some cases, the cause of TMJ syndrome may not be known. SIGNS AND SYMPTOMS The most common symptom is an aching pain on the side of the head in the area of the TMJ. Other symptoms may include:  Pain when moving your jaw, such as when chewing or biting.  Being unable to open your jaw all the way.  Making a clicking sound when you open your mouth.  Headache.  Earache.  Neck or shoulder pain. DIAGNOSIS Diagnosis can usually be made based on your symptoms, your medical history, and a physical exam. Your health  care provider may check the range of motion of your jaw. Imaging tests, such as X-rays or an MRI, are sometimes done. You may need to see your dentist to determine if your teeth and jaw are lined up correctly. TREATMENT TMJ syndrome often goes away on its own. If treatment is needed, the options may include:  Eating soft foods and applying ice or heat.  Medicines to relieve pain or inflammation.  Medicines to relax the muscles.  A splint, bite plate, or mouthpiece to prevent teeth grinding or jaw clenching.  Relaxation techniques or counseling to help reduce stress.  Transcutaneous electrical nerve stimulation (TENS). This helps to relieve pain by applying an electrical current through the skin.  Acupuncture. This is sometimes helpful to relieve pain.  Jaw surgery. This is rarely needed. HOME CARE INSTRUCTIONS  Take medicines only as directed by your health care provider.  Eat a soft diet if you are having trouble chewing.  Apply ice to the painful area.  Put ice in a plastic bag.  Place a towel between your skin and the bag.  Leave the ice on for 20 minutes, 2-3 times a day.  Apply a warm compress to the painful area as directed.  Massage your jaw area and perform any jaw stretching exercises as recommended by your health care provider.  If you were given a mouthpiece or bite plate, wear it as directed.  Avoid foods that require a lot of chewing. Do  not chew gum.  Keep all follow-up visits as directed by your health care provider. This is important. SEEK MEDICAL CARE IF:  You are having trouble eating.  You have new or worsening symptoms. SEEK IMMEDIATE MEDICAL CARE IF:  Your jaw locks open or closed.   This information is not intended to replace advice given to you by your health care provider. Make sure you discuss any questions you have with your health care provider.   Document Released: 06/19/2001 Document Revised: 10/15/2014 Document Reviewed:  04/29/2014 Elsevier Interactive Patient Education [employer].

## 2015-12-01 NOTE — Assessment & Plan Note (Signed)
Symptoms most consistent with right TMJ disorder likely associated with stress and possible teeth grinding -  Educated on TMJ and avoiding exacerbating movements -  Naproxen 500 mg twice a day times 1-2 weeks -  Follow-up with PCP if symptoms not improving or resolved at that time -  Check CBC and CMP;  Given history of rheumatic heart disease and no recent blood work -  Consider TCA or qhs muscle relaxer if pain not improved with NSAIDs

## 2015-12-01 NOTE — Progress Notes (Signed)
   Subjective:    Patient ID: Megan Atkins, female    DOB: [birth year], 40 y.o.   MRN: [id number]  Seen for Same day visit for   CC: right ear pain   she reports right jaw and ear pain for 2 weeks.  Pain is constant, achy, worse with jaw movement and chewing.  She's not tried anything to help with the pain.  Associated with ringing in her ear.  Denies fevers, chills, runny nose, decreased hearing , rash.  She does report increased stress in her life, especially the past few weeks associated with work.  Has history of rheumatic heart disease and recent echo showing mitral stenosis.   Smoking history noted  Objective:  BP 98/63 mmHg  Pulse 65  Temp(Src) 97.4 F (36.3 C) (Oral)  Wt 140 lb (63.504 kg)  LMP 11/11/2015  General: NAD HEENT: Right TM clear;  Tenderness to palpation over right TMJ joint;  No crepitus or clicking appreciated;  No rash over right neck or ear. PERRLA, EOMI Cardiac: RRR, normal heart sounds, no murmurs. 2+ radial and PT pulses bilaterally Respiratory: CTAB, normal effort Skin: warm and dry, no rashes noted Neuro: alert and oriented, no focal deficits  Assessment & Plan:   TMJ syndrome  Symptoms most consistent with right TMJ disorder likely associated with stress and possible teeth grinding -  Educated on TMJ and avoiding exacerbating movements -  Naproxen 500 mg twice a day times 1-2 weeks -  Follow-up with PCP if symptoms not improving or resolved at that time -  Check CBC and CMP;  Given history of rheumatic heart disease and no recent blood work -  Consider TCA or qhs muscle relaxer if pain not improved with NSAIDs

## 2015-12-05 ENCOUNTER — Encounter: Payer: Self-pay | Admitting: Family Medicine

## 2016-11-08 ENCOUNTER — Other Ambulatory Visit: Payer: Self-pay | Admitting: Cardiovascular Disease

## 2016-12-13 ENCOUNTER — Other Ambulatory Visit: Payer: Self-pay | Admitting: Cardiovascular Disease

## 2016-12-13 ENCOUNTER — Telehealth: Payer: Self-pay | Admitting: Cardiovascular Disease

## 2016-12-13 NOTE — Telephone Encounter (Signed)
Medication Detail    Disp Refills Start End   atenolol (TENORMIN) 50 MG tablet 30 tablet 0 12/13/2016    Sig - Route: Take 1 tablet (50 mg total) by mouth daily. *Please keep 12/18/16 appointment for further refills* - Oral   E-Prescribing Status: Receipt confirmed by pharmacy (12/13/2016 2:57 PM EST)   Pharmacy   [id number] - [place], [place] - [address]

## 2016-12-13 NOTE — Telephone Encounter (Signed)
New Message    Pt has appt for 12/18/16 1:30p  *STAT* If patient is at the pharmacy, call can be transferred to refill team.   1. Which medications need to be refilled? (please list name of each medication and dose if known)  atenolol (TENORMIN) 50 MG tablet Take 1 tablet (50 mg total) by mouth daily. *Patient needs to call and schedule an appointment for further      2. Which pharmacy/location (including street and city if local pharmacy) is [facility]   3. Do they need a 30 day or 90 day supply? ?

## 2016-12-17 NOTE — Progress Notes (Addendum)
CARDIOLOGY OFFICE NOTE  Date:  12/18/2016    [name] Date of Birth: [birth year] Medical Record #[id number]  PCP:  Annabell Sabal, MD  Cardiologist:  Gillian Shields    Chief Complaint  Patient presents with  . Cardiac Valve Problem    Seen for Dr. Johnsie Cancel    History of Present Illness: Megan Atkins is a 41 y.o. female who presents today for a follow up visit. Seen for Dr. Johnsie Cancel.   She is from Megan Atkins - apparently referred by Dr Mingo Megan Atkins back in 2014 for history of RHD. The chart notes that she was previously on amiodarone, digoxen and atenolol - for "palpitations". Digoxin and amiodarone have been stopped.  Has not been on blood thinner and has had some issues in the past with rectal bleeding.    She has not been seen since December of 2016. Echo was updated at that time.   Comes in today. Here alone. Needing atenolol refilled. She says she is doing ok. Denies chest pain. Breathing ok. Notes occasional palpitation - may be progressive. Will have spells of "heart pounding" about 3 times a month and that it is "irregular". Sounds like it lasts for a few minutes - she is wondering "what is wrong". Taking her Atenolol. BP on the soft side. No syncope.    Past Medical History:  Diagnosis Date  . Hemorrhoid 11/2012  . Mitral stenosis    last echo 05/2011 at [facility]  . Palpitations   . Rheumatic heart disease     Past Surgical History:  Procedure Laterality Date  . HEMORRHOID SURGERY N/A 11/18/2012   Procedure: HEMORRHOIDECTOMY;  Surgeon: Megan Atkins. Megan Dover, MD;  Location: [place];  Service: General;  Laterality: N/A;  . NO PAST SURGERIES       Medications: Current Outpatient Prescriptions  Medication Sig Dispense Refill  . atenolol (TENORMIN) 50 MG tablet Take 1 tablet (50 mg total) by mouth daily. 90 tablet 3   No current facility-administered medications for this visit.     Allergies: No Known Allergies  Social History: The patient  reports  that she has never smoked. She has never used smokeless tobacco. She reports that she does not drink alcohol or use drugs.   Family History: The patient's Family history is unknown by patient.   Review of Systems: Please see the history of present illness.   Otherwise, the review of systems is positive for none.   All other systems are reviewed and negative.   Physical Exam: VS:  BP 98/68   Pulse 67   Ht 5\' 3"  (1.6 m)   Wt 141 lb 12.8 oz (64.3 kg)   BMI 25.12 kg/m  .  BMI Body mass index is 25.12 kg/m.  Wt Readings from Last 3 Encounters:  12/18/16 141 lb 12.8 oz (64.3 kg)  12/01/15 140 lb (63.5 kg)  09/13/15 139 lb 9.6 oz (63.3 kg)    General: Pleasant. Well developed, well nourished and in no acute distress.   HEENT: Normal.  Neck: Supple, no JVD, carotid bruits, or masses noted.  Cardiac: Regular rate and rhythm. No murmurs, rubs, or gallops. No edema.  Respiratory:  Lungs are clear to auscultation bilaterally with normal work of breathing.  GI: Soft and nontender.  MS: No deformity or atrophy. Gait and ROM intact.  Skin: Warm and dry. Color is normal.  Neuro:  Strength and sensation are intact and no gross focal deficits noted.  Psych: Alert, appropriate and with normal  affect.   LABORATORY DATA:  EKG:  EKG is ordered today. This demonstrates NSR.  Lab Results  Component Value Date   WBC 5.6 12/01/2015   HGB 13.1 12/01/2015   HCT 36.7 12/01/2015   PLT 219 12/01/2015   GLUCOSE 78 12/01/2015   ALT 14 12/01/2015   AST 14 12/01/2015   NA 141 12/01/2015   K 4.1 12/01/2015   CL 107 12/01/2015   CREATININE 0.44 (L) 12/01/2015   BUN 7 12/01/2015   CO2 26 12/01/2015    BNP (last 3 results) No results for input(s): BNP in the last 8760 hours.  ProBNP (last 3 results) No results for input(s): PROBNP in the last 8760 hours.   Other Studies Reviewed Today:  Echo Study Conclusions from 09/2015  - Left ventricle: The cavity size was normal. Systolic function  was   normal. The estimated ejection fraction was in the range of 60%   to 65%. Wall motion was normal; there were no regional wall   motion abnormalities. - Mitral valve: Mildly thickened, mildly calcified leaflets .   Mobility of the anterior leaflet was mildly restricted. Diastolic   leaflet doming was present. (Apperance of rheumatic mitral   valve). The findings are consistent with moderate stenosis. There   was mild regurgitation. Mean gradient (D): 9 mm Hg. Valve area by   pressure half-time: 1.33 cm^2. Valve area by continuity equation   (using LVOT flow): 0.58 cm^2. - Left atrium: The atrium was severely dilated. Volume/bsa, ES   (1-plane Simpson&'s, A4C): 55.7 (previously 44) ml/m^2. LA has   increased in size when compared to prior (severe is considered >   66ml/BSA). - Pulmonary arteries: Systolic pressure was mildly increased. PA   peak pressure: 34 mm Hg (S).  Impressions:  - Compared to the prior study, there has been no significant   interval change.  Assessment/Plan:  1. Moderate MS - needs echo updated. Atenolol refilled.   2. RHD - as above. No AF noted. I'm not convinced that she has a good understanding of what is wrong with her and the testing that is needed going forward.   3. Palpitations - hard to assess if this is progressive. Some of her history suggests that it may be. I have recommended an event monitor but she does not wish to have at this time. Reiterated several times that she could be having AF - especially with her valve problem - and this would necessitate a change in her regimen. She does not wish to proceed at this time.   Current medicines are reviewed with the patient today.  The patient does not have concerns regarding medicines other than what has been noted above.  The following changes have been made:  See above.  Labs/ tests ordered today include:    Orders Placed This Encounter  Procedures  . Cardiac event monitor  . EKG 12-Lead    . ECHOCARDIOGRAM COMPLETE     Disposition:   FU with Dr. Johnsie Cancel in one year.   Patient is agreeable to this plan and will call if any problems develop in the interim.   SignedTruitt Merle, NP  12/18/2016 2:04 PM  [facility] [address] [place], [place]  [address] Phone: [phone] Fax: [phone]

## 2016-12-18 ENCOUNTER — Encounter: Payer: Self-pay | Admitting: Nurse Practitioner

## 2016-12-18 ENCOUNTER — Other Ambulatory Visit: Payer: Self-pay | Admitting: *Deleted

## 2016-12-18 ENCOUNTER — Encounter (INDEPENDENT_AMBULATORY_CARE_PROVIDER_SITE_OTHER): Payer: Self-pay

## 2016-12-18 ENCOUNTER — Ambulatory Visit (INDEPENDENT_AMBULATORY_CARE_PROVIDER_SITE_OTHER): Payer: BLUE CROSS/BLUE SHIELD | Admitting: Nurse Practitioner

## 2016-12-18 VITALS — BP 98/68 | HR 67 | Ht 63.0 in | Wt 141.8 lb

## 2016-12-18 DIAGNOSIS — R002 Palpitations: Secondary | ICD-10-CM | POA: Diagnosis not present

## 2016-12-18 DIAGNOSIS — I099 Rheumatic heart disease, unspecified: Secondary | ICD-10-CM

## 2016-12-18 DIAGNOSIS — I050 Rheumatic mitral stenosis: Secondary | ICD-10-CM | POA: Diagnosis not present

## 2016-12-18 MED ORDER — ATENOLOL 50 MG PO TABS: 50.0000 mg | ORAL_TABLET | Freq: Every day | ORAL | 3 refills | Status: DC

## 2016-12-18 NOTE — Patient Instructions (Addendum)
We will be checking the following labs today - NONE   Medication Instructions:    Continue with your current medicines.   I refilled the Atenolol today.     Testing/Procedures To Be Arranged:  Echocardiogram  Event monitor.  Follow-Up:   See Dr. Johnsie Cancel in one year.     Other Special Instructions:   N/A    If you need a refill on your cardiac medications before your next appointment, please call your pharmacy.   Call the [place] office at [phone] if you have any questions, problems or concerns.

## 2017-01-03 ENCOUNTER — Ambulatory Visit (HOSPITAL_COMMUNITY): Payer: BLUE CROSS/BLUE SHIELD | Attending: Cardiovascular Disease

## 2017-01-03 ENCOUNTER — Other Ambulatory Visit: Payer: Self-pay

## 2017-01-03 DIAGNOSIS — I050 Rheumatic mitral stenosis: Secondary | ICD-10-CM | POA: Diagnosis not present

## 2017-01-03 DIAGNOSIS — R002 Palpitations: Secondary | ICD-10-CM

## 2017-01-03 DIAGNOSIS — I052 Rheumatic mitral stenosis with insufficiency: Secondary | ICD-10-CM | POA: Insufficient documentation

## 2017-01-03 DIAGNOSIS — I099 Rheumatic heart disease, unspecified: Secondary | ICD-10-CM

## 2017-06-28 ENCOUNTER — Ambulatory Visit (INDEPENDENT_AMBULATORY_CARE_PROVIDER_SITE_OTHER): Payer: BLUE CROSS/BLUE SHIELD | Admitting: Family Medicine

## 2017-06-28 ENCOUNTER — Encounter: Payer: Self-pay | Admitting: Family Medicine

## 2017-06-28 VITALS — BP 92/58 | HR 69 | Temp 98.1°F | Ht 63.0 in | Wt 143.2 lb

## 2017-06-28 DIAGNOSIS — R002 Palpitations: Secondary | ICD-10-CM

## 2017-06-28 DIAGNOSIS — I959 Hypotension, unspecified: Secondary | ICD-10-CM

## 2017-06-28 DIAGNOSIS — H1013 Acute atopic conjunctivitis, bilateral: Secondary | ICD-10-CM

## 2017-06-28 DIAGNOSIS — M722 Plantar fascial fibromatosis: Secondary | ICD-10-CM

## 2017-06-28 DIAGNOSIS — Z0000 Encounter for general adult medical examination without abnormal findings: Secondary | ICD-10-CM

## 2017-06-28 DIAGNOSIS — H1010 Acute atopic conjunctivitis, unspecified eye: Secondary | ICD-10-CM | POA: Insufficient documentation

## 2017-06-28 MED ORDER — ATENOLOL 25 MG PO TABS: 25.0000 mg | ORAL_TABLET | Freq: Every day | ORAL | 1 refills | Status: DC

## 2017-06-28 MED ORDER — OLOPATADINE HCL 0.1 % OP SOLN: 1.0000 [drp] | Freq: Two times a day (BID) | OPHTHALMIC | 2 refills | Status: DC

## 2017-06-28 NOTE — Assessment & Plan Note (Signed)
Treat with Patanol. If no improvement she can try over-the-counter cetirizine.

## 2017-06-28 NOTE — Assessment & Plan Note (Signed)
Right heel. She continues to have improvement in her pain with stretches and good shoe support. Declined any pain medications today. Follow-up if any worsening.

## 2017-06-28 NOTE — Progress Notes (Signed)
Subjective:    Megan Atkins is a 41 y.o. female who presents to [facility] today for palpitations:  1.  Palpitations:  Patient seen earlier this year by cardiology, March 2018.  Still with palpitations at that time, as well as currently.  She states that these occur infrequently. Can sometimes happen once a week. Other times she will go month or 2 without any palpitations. They last for 1-2 seconds and resolve when she coughs or takes a deep breath. She denies any presyncopal or syncopal episodes when they occur. No falls. No shortness breath or chest pain.  Does occasionally have lightheadedness if she stands too quickly.    2.  Itchy eyes:  Present for the past several weeks. She also has trouble with this in the spring.  Describes itchy runny eyes. She does not have any nasal drainage. She went to the pharmacy but cannot find anything that would help with itchy eyes. No fevers or chills.  3.  Heel pain:  Present for the past several months. Worse in the morning. It is her right heel. She has been stretching her foot and wearing shoes with good support and this has helped her pain. She has not needed to take any over-the-counter pain analgesics. No injuries to her foot. No swelling or redness to her foot.   ROS as above per HPI.    The following portions of the patient's history were reviewed and updated as appropriate: allergies, current medications, past medical history, family and social history, and problem list. Patient is a nonsmoker.    PMH reviewed.  Past Medical History:  Diagnosis Date  . Hemorrhoid 11/2012  . Mitral stenosis    last echo 05/2011 at [facility]  . Palpitations   . Rheumatic heart disease    Past Surgical History:  Procedure Laterality Date  . HEMORRHOID SURGERY N/A 11/18/2012   Procedure: HEMORRHOIDECTOMY;  Surgeon: Imogene Burn. Georgette Dover, MD;  Location: [place];  Service: General;  Laterality: N/A;  . NO PAST SURGERIES      Medications  reviewed. Current Outpatient Prescriptions  Medication Sig Dispense Refill  . atenolol (TENORMIN) 50 MG tablet Take 1 tablet (50 mg total) by mouth daily. 90 tablet 3   No current facility-administered medications for this visit.      Objective:   Physical Exam Pulse 69   Temp 98.1 F (36.7 C) (Oral)   Ht 5\' 3"  (1.6 m)   Wt 143 lb 3.2 oz (65 kg)   LMP 06/24/2017 (Exact Date)   SpO2 99%   BMI 25.37 kg/m  Gen:  Alert, cooperative patient who appears stated age in no acute distress.  Vital signs reviewed. HEENT: EOMI, PERRL.  Mild scleral injection BL today without any drainage noted.  Ears and nose are clear/normal.  MMM Cardiac:  Regular rate and rhythm  Pulm:  Clear to auscultation bilaterally with good air movement.  No wheezes or rales noted.   Abd:  Soft/nondistended/nontender.  Good bowel sounds throughout all four quadrants.  No masses noted.  Exts: Non edematous BL  LE, warm and well perfused.  MSK:  Nontender BL feet today plantar aspects.    No results found for this or any previous visit (from the past 72 hour(s)).

## 2017-06-28 NOTE — Patient Instructions (Signed)
Your blood pressure is still a little low. Because you're becoming lightheaded we are going to decrease your atenolol to 25 mg. I have sent in a new prescription for you.  Because we are decreasing the dose your palpitations may get worse. If this happens I need to know.Please come back and see me.  Your heel pain is called plantar fasciitis. You're doing all the right things such as stretching in the morning and wearing shoes for support. If this continues to bother you please let me know.  You also have allergic conjunctivitis which is just allergies for your eyes. You can take the Patanol eyedrops to help with this. You also purchase over-the-counter cetirizine to help if you're not feeling any better. You can stop this once allergy season has ended.  If you are otherwise doing well I will see you back in 6 months.

## 2017-06-28 NOTE — Assessment & Plan Note (Signed)
She declined Pap smear today.

## 2017-06-28 NOTE — Assessment & Plan Note (Signed)
Her blood pressure is a little on the low side running 53G systolic. She does have some lightheadedness when she stands quickly. This is worse in the morning. She is currently on 50 mg of atenolol which she has been on for years prior to coming to the [place]. Mostly for palpitations which aren't not clearly diagnosed. -Plan is to decrease her atenolol to 25 mg to prevent any falls orworsening of her hypotension. I did discuss with her (see after visit summary) that this may worsen her palpitations and if so she needs to not to wait and come back and see me quickly. Discussed may need outpatient heart monitor at that point and she seemed to be more accepting of this

## 2017-06-28 NOTE — Assessment & Plan Note (Signed)
persists States that this happens anywhere from once a week to several months in between. Resolves with cough or deep breath making it sounds like supraventricular tachycardia. She is at increased risk for atrial fibrillation secondary to enlarged atria. Enlarged atria noted again on echo from March of this past year. She again declines any outpatient heart monitoring. Discussed with her the importance of palpitations and what it may herald. She seemed to have a better understanding of this after today. She plans to return if the palpitations worsen.

## 2017-07-10 ENCOUNTER — Encounter (HOSPITAL_COMMUNITY): Payer: Self-pay | Admitting: Emergency Medicine

## 2017-07-10 ENCOUNTER — Ambulatory Visit (HOSPITAL_COMMUNITY)
Admission: EM | Admit: 2017-07-10 | Discharge: 2017-07-10 | Disposition: A | Discharge status: Home / Self Care | Payer: BLUE CROSS/BLUE SHIELD | Attending: Family Medicine | Admitting: Family Medicine

## 2017-07-10 DIAGNOSIS — R0789 Other chest pain: Secondary | ICD-10-CM | POA: Diagnosis not present
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
## 2017-07-10 NOTE — Discharge Instructions (Signed)
Take 400mg  ibuprofen every 6 hours as needed. You can also take tylenol every 6 hours on top of ibuprofen for additional relief if needed.

## 2017-12-16 ENCOUNTER — Other Ambulatory Visit: Payer: Self-pay | Admitting: Family Medicine

## 2018-02-13 ENCOUNTER — Other Ambulatory Visit: Payer: Self-pay | Admitting: Family Medicine

## 2018-05-13 ENCOUNTER — Encounter: Payer: Self-pay | Admitting: Family Medicine

## 2018-05-13 ENCOUNTER — Ambulatory Visit: Payer: BLUE CROSS/BLUE SHIELD | Admitting: Family Medicine

## 2018-05-13 ENCOUNTER — Other Ambulatory Visit: Payer: Self-pay

## 2018-05-13 VITALS — BP 108/64 | HR 80 | Temp 98.3°F | Ht 63.0 in | Wt 146.6 lb

## 2018-05-13 DIAGNOSIS — R21 Rash and other nonspecific skin eruption: Secondary | ICD-10-CM | POA: Diagnosis not present

## 2018-05-13 MED ORDER — FLUTICASONE PROPIONATE 50 MCG/ACT NA SUSP: 2.0000 | Freq: Every day | NASAL | 6 refills | Status: DC

## 2018-05-13 MED ORDER — CLOTRIMAZOLE-BETAMETHASONE 1-0.05 % EX CREA: 1.0000 "application " | TOPICAL_CREAM | Freq: Two times a day (BID) | CUTANEOUS | 0 refills | Status: DC

## 2018-05-13 MED ORDER — AZELASTINE HCL 0.05 % OP SOLN: 1.0000 [drp] | Freq: Two times a day (BID) | OPHTHALMIC | 12 refills | Status: DC | PRN

## 2018-05-13 NOTE — Patient Instructions (Addendum)
It was good to see you again today.    I have sent in Lotrisone for your to use on your Left leg.  Use this once in the AM and once in the PM for the next 2 weeks.  If the rash is not gone by that point please let me know.  Stop using the olopatadine.  Use the Azelastine as needed and the Flonase 2 sprays in each nostril daily.  This is preventative.

## 2018-05-13 NOTE — Progress Notes (Signed)
Subjective:    Megan Atkins is a 42 y.o. female who presents to [facility] today for Rash:  1.  Rash:  Present for past week or so.  Has had recurrent issue every year around this time.  2 years ago treated with prescribed cream and went away.  Last year resolved without need for cream.  This year, started as small patch on left shin and progressed to larger, contiguous patch.  Has tried numerous OTC moisturizing lotions without relief.  No fevers or chills.  No one with similar rash at home.  No recent illnesses.    ROS as above per HPI.    The following portions of the patient's history were reviewed and updated as appropriate: allergies, current medications, past medical history, family and social history, and problem list. Patient is a nonsmoker.    PMH reviewed.  Past Medical History:  Diagnosis Date  . Hemorrhoid 11/2012  . Mitral stenosis    last echo 05/2011 at [facility]  . Palpitations   . Rheumatic heart disease    Past Surgical History:  Procedure Laterality Date  . HEMORRHOID SURGERY N/A 11/18/2012   Procedure: HEMORRHOIDECTOMY;  Surgeon: Imogene Burn. Georgette Dover, MD;  Location: [place];  Service: General;  Laterality: N/A;  . NO PAST SURGERIES      Medications reviewed. Current Outpatient Medications  Medication Sig Dispense Refill  . atenolol (TENORMIN) 25 MG tablet TAKE 1 TABLET BY MOUTH EVERY DAY 90 tablet 1  . olopatadine (PATANOL) 0.1 % ophthalmic solution INSTILL 1 DROP INTO BOTH EYES TWICE A DAY 5 mL 2   No current facility-administered medications for this visit.      Objective:   Physical Exam BP 108/64   Pulse 80   Temp 98.3 F (36.8 C) (Oral)   Ht 5\' 3"  (1.6 m)   Wt 146 lb 9.6 oz (66.5 kg)   LMP 04/08/2018   SpO2 99%   BMI 25.97 kg/m  Gen:  Alert, cooperative patient who appears stated age in no acute distress.  Vital signs reviewed. HEENT: EOMI,  MMM Skin:  5 x 10 cm macular rash located on anterior shin of Left leg.  Scaliness at  edges but not in central region.  Some excoriations at edges as well.   Imp/Plan: 1. Rash:  - Unclear etiology, though looks like possible fungal infection.  - pretty itchy according to patient.  - plan treatment with lotrisone to cover for itching (steroid cream) and anti-fungal.  If variant of eczema/psoriasis, the steroid aspect would treat this as well.  - FU if no improvement in 2 weeks.  Sooner if worsening.    No results found for this or any previous visit (from the past 72 hour(s)).

## 2018-05-27 ENCOUNTER — Encounter: Payer: BLUE CROSS/BLUE SHIELD | Admitting: Family Medicine

## 2018-05-27 DIAGNOSIS — Z23 Encounter for immunization: Secondary | ICD-10-CM | POA: Diagnosis not present

## 2018-06-24 ENCOUNTER — Other Ambulatory Visit: Payer: Self-pay | Admitting: Family Medicine

## 2018-07-16 ENCOUNTER — Other Ambulatory Visit (HOSPITAL_COMMUNITY)
Admission: RE | Admit: 2018-07-16 | Discharge: 2018-07-16 | Disposition: A | Discharge status: Home / Self Care | Payer: BLUE CROSS/BLUE SHIELD | Source: Ambulatory Visit | Attending: Family Medicine | Admitting: Family Medicine

## 2018-07-16 ENCOUNTER — Ambulatory Visit (INDEPENDENT_AMBULATORY_CARE_PROVIDER_SITE_OTHER): Payer: BLUE CROSS/BLUE SHIELD | Admitting: Family Medicine

## 2018-07-16 ENCOUNTER — Encounter: Payer: Self-pay | Admitting: Family Medicine

## 2018-07-16 VITALS — BP 100/60 | HR 62 | Temp 98.4°F | Wt 148.6 lb

## 2018-07-16 DIAGNOSIS — H269 Unspecified cataract: Secondary | ICD-10-CM

## 2018-07-16 DIAGNOSIS — Z0000 Encounter for general adult medical examination without abnormal findings: Secondary | ICD-10-CM | POA: Diagnosis not present

## 2018-07-16 LAB — POCT WET PREP (WET MOUNT)
Clue Cells Wet Prep Whiff POC: NEGATIVE
Trichomonas Wet Prep HPF POC: ABSENT

## 2018-07-16 MED ORDER — FLUCONAZOLE 150 MG PO TABS: 150.0000 mg | ORAL_TABLET | Freq: Once | ORAL | 0 refills | Status: AC

## 2018-07-16 NOTE — Patient Instructions (Signed)
It was great to see you again today.  We are checking blood work to see about your bleeding gums.  I think this is likely a dental problem like gingivitis and recommend you get back into see a dentist.  I will call with the results of the Pap smear and the blood test.  The eye doctor will call you sometime next week to schedule an appointment.

## 2018-07-16 NOTE — Progress Notes (Signed)
Megan Atkins is a 42 y.o. female who presents to [place] today for a comprehensive physical examination:  CPE  Concerns: Bleeding gums.  Patient has been having bleeding from gums and brushing teeth.  Denies any other bruising or other bleeding: No hematochezia, no melena.  Vaginal bleeding is usual once a month and for 3 to 4 days but not particularly heavy.  2.  Cataract: -She is concerned she has cataract in her right eye.  She is having to use glasses at nighttime which she was not having to do this before.  No other decrease in her vision that she knows of.  Last physical last year Flu vaccine due for this Eye exam: Referral to ophthalmology Dental exam every six months -though it t has been about 2 years since she last saw dentist.   Jette reviewed. Patient is a nonsmoker.   Past Medical History:  Diagnosis Date  . Hemorrhoid 11/2012  . Mitral stenosis    last echo 05/2011 at [facility]  . Palpitations   . Rheumatic heart disease    Past Surgical History:  Procedure Laterality Date  . HEMORRHOID SURGERY N/A 11/18/2012   Procedure: HEMORRHOIDECTOMY;  Surgeon: Imogene Burn. Georgette Dover, MD;  Location: [place];  Service: General;  Laterality: N/A;  . NO PAST SURGERIES      Medications reviewed. Current Outpatient Medications  Medication Sig Dispense Refill  . atenolol (TENORMIN) 25 MG tablet TAKE 1 TABLET BY MOUTH EVERY DAY 90 tablet 1  . azelastine (OPTIVAR) 0.05 % ophthalmic solution Place 1 drop into both eyes 2 (two) times daily as needed. 6 mL 12  . clotrimazole-betamethasone (LOTRISONE) cream Apply 1 application topically 2 (two) times daily. 30 g 0  . fluticasone (FLONASE) 50 MCG/ACT nasal spray Place 2 sprays into both nostrils daily. 16 g 6  . olopatadine (PATANOL) 0.1 % ophthalmic solution INSTILL 1 DROP INTO BOTH EYES TWICE A DAY 5 mL 2   No current facility-administered medications for this visit.     Social: Smoking history:   denies Alcohol use:  denies Illicit drug use:  denies Relationship status:   married  Family History:  No relevant family history.  No family history of bleeding disorders.    Review of Systems  Constitutional: Negative for fever.  HENT: Negative for congestion, ear discharge, ear pain and hearing loss.   Eyes: Negative for blurred vision.  Teeth:  Bleeding bums when brushing teeth.   Respiratory: Negative for cough and wheezing.   Cardiovascular: Negative for chest pain, palpitations and leg swelling.  Gastrointestinal: Negative for nausea, vomiting and abdominal pain.  Genitourinary: Negative for dysuria, hematuria and flank pain.  Musculoskeletal: Negative for neck pain.  Skin: Negative for rash or bruising.   Neurological: Negative for dizziness and headaches.  Psychiatric/Behavioral: Negative for depression and suicidal ideas.   Exam: BP 100/60   Pulse 62   Temp 98.4 F (36.9 C) (Oral)   Wt 148 lb 9.6 oz (67.4 kg)   LMP 07/09/2018 (Exact Date)   SpO2 99%   BMI 26.32 kg/m  Gen:  Alert, cooperative patient who appears stated age in no acute distress.  Vital signs reviewed. Head: [place]/AT.   Eyes:  EOMI, PERRL cataract in right eye..   Ears:  External ears WNL, Bilateral TM's normal without retraction, redness or bulging. Nose:  Septum midline  Mouth:  MMM.  She does have some food between her teeth consistent with infection has not been brushing her teeth  very well to to the bleeding.  She has red inflamed gums consistent with gingivitis. Neck: No masses or thyromegaly or limitation in range of motion.  No cervical lymphadenopathy. Pulm:  Clear to auscultation bilaterally with good air movement.  No wheezes or rales noted.   Cardiac:  Regular rate and rhythm without murmur auscultated.  Good S1/S2. Abd:  Soft/nondistended/nontender.  Good bowel sounds throughout all four quadrants.  No masses noted.  Ext:  No clubbing/cyanosis/erythema.  No edema noted bilateral lower  extremities.   GYN:  External genitalia within normal limits.  Vaginal mucosa pink, moist, normal rugae.  Nonfriable cervix without lesions, no discharge or bleeding noted on speculum exam.  Pap smear obtained   Neuro:  Grossly normal, no gait abnormalities Psych:  Not depressed or anxious appearing.  Conversant and engaged  Impression/Plan: 1. Complete Physical Examination: anticipatory guidance provided.  2.  Pap smear obtained today.  HIV. 3.  Gingivitis:  Referral to dentistry 4.  Screening cholesterol: obtain FLP. 5.  Right sided cataract:  Referral to optho at patient request.

## 2018-07-17 LAB — COMPREHENSIVE METABOLIC PANEL
A/G RATIO: 1.9 (ref 1.2–2.2)
ALT: 15 IU/L (ref 0–32)
AST: 15 IU/L (ref 0–40)
Albumin: 4.5 g/dL (ref 3.5–5.5)
Alkaline Phosphatase: 67 IU/L (ref 39–117)
BILIRUBIN TOTAL: 0.7 mg/dL (ref 0.0–1.2)
BUN/Creatinine Ratio: 11 (ref 9–23)
BUN: 6 mg/dL (ref 6–24)
CO2: 23 mmol/L (ref 20–29)
Calcium: 9.2 mg/dL (ref 8.7–10.2)
Chloride: 103 mmol/L (ref 96–106)
Creatinine, Ser: 0.57 mg/dL (ref 0.57–1.00)
GFR, EST AFRICAN AMERICAN: 132 mL/min/{1.73_m2} (ref >59)
GFR, EST NON AFRICAN AMERICAN: 115 mL/min/{1.73_m2} (ref >59)
Globulin, Total: 2.4 g/dL (ref 1.5–4.5)
Glucose: 90 mg/dL (ref 65–99)
POTASSIUM: 4.4 mmol/L (ref 3.5–5.2)
Sodium: 139 mmol/L (ref 134–144)
Total Protein: 6.9 g/dL (ref 6.0–8.5)

## 2018-07-17 LAB — CBC
HEMATOCRIT: 40.1 % (ref 34.0–46.6)
HEMOGLOBIN: 13.4 g/dL (ref 11.1–15.9)
MCH: 28.2 pg (ref 26.6–33.0)
MCHC: 33.4 g/dL (ref 31.5–35.7)
MCV: 84 fL (ref 79–97)
Platelets: 208 10*3/uL (ref 150–450)
RBC: 4.76 x10E6/uL (ref 3.77–5.28)
RDW: 12.6 % (ref 12.3–15.4)
WBC: 7 10*3/uL (ref 3.4–10.8)

## 2018-07-17 LAB — LIPID PANEL
Chol/HDL Ratio: 3.5 ratio (ref 0.0–4.4)
Cholesterol, Total: 138 mg/dL (ref 100–199)
HDL: 40 mg/dL (ref >39)
LDL CALC: 78 mg/dL (ref 0–99)
Triglycerides: 101 mg/dL (ref 0–149)
VLDL CHOLESTEROL CAL: 20 mg/dL (ref 5–40)

## 2018-07-17 LAB — HIV ANTIBODY (ROUTINE TESTING W REFLEX): HIV SCREEN 4TH GENERATION: NONREACTIVE

## 2018-07-18 LAB — CYTOLOGY - PAP
Diagnosis: NEGATIVE
HPV: NOT DETECTED

## 2018-07-20 ENCOUNTER — Encounter: Payer: Self-pay | Admitting: Family Medicine

## 2018-07-22 ENCOUNTER — Encounter: Payer: Self-pay | Admitting: Family Medicine

## 2018-07-22 ENCOUNTER — Telehealth: Payer: Self-pay | Admitting: Family Medicine

## 2018-07-22 NOTE — Telephone Encounter (Signed)
Called and discussed normal lab results and normal Pap smear results with patient.  She was appreciative of call.  Waiting to hear from ophthalmologist.  Referral was placed to last visit and is currently pending.  She still has some minor gum bleeding and will make an appointment to see the dentist.

## 2018-08-08 ENCOUNTER — Encounter (INDEPENDENT_AMBULATORY_CARE_PROVIDER_SITE_OTHER): Payer: BLUE CROSS/BLUE SHIELD | Admitting: Ophthalmology

## 2018-09-25 DIAGNOSIS — H401131 Primary open-angle glaucoma, bilateral, mild stage: Secondary | ICD-10-CM | POA: Diagnosis not present

## 2018-12-01 DIAGNOSIS — H401131 Primary open-angle glaucoma, bilateral, mild stage: Secondary | ICD-10-CM | POA: Diagnosis not present

## 2018-12-06 ENCOUNTER — Other Ambulatory Visit: Payer: Self-pay | Admitting: Family Medicine

## 2018-12-16 ENCOUNTER — Other Ambulatory Visit: Payer: Self-pay | Admitting: Ophthalmology

## 2018-12-16 DIAGNOSIS — H469 Unspecified optic neuritis: Secondary | ICD-10-CM

## 2018-12-23 ENCOUNTER — Telehealth: Payer: Self-pay

## 2018-12-23 NOTE — Telephone Encounter (Signed)
Left message for patient to call back. Need to cancel appointment, due to [place] precautions. Will need to reschedule at a later date.

## 2018-12-25 NOTE — Telephone Encounter (Signed)
Left second message for patient to call back. Stated on message that we are calling to cancel appointment on Friday.

## 2018-12-25 NOTE — Telephone Encounter (Signed)
Will cancel appt. When patient calls back need to schedule for an echo for Mitral stenosis and follow up visit with Dr. Johnsie Cancel in 6 to 8 weeks.

## 2018-12-26 ENCOUNTER — Ambulatory Visit: Payer: BLUE CROSS/BLUE SHIELD | Admitting: Cardiovascular Disease

## 2019-01-12 ENCOUNTER — Telehealth: Payer: Self-pay

## 2019-01-12 NOTE — Telephone Encounter (Signed)
I have attempted without success to contact this patient by phone about appointment but patient doesn't have VM set. Will try again later.

## 2019-03-10 ENCOUNTER — Other Ambulatory Visit: Payer: Self-pay

## 2019-03-10 ENCOUNTER — Other Ambulatory Visit: Payer: BLUE CROSS/BLUE SHIELD

## 2019-03-10 ENCOUNTER — Telehealth (INDEPENDENT_AMBULATORY_CARE_PROVIDER_SITE_OTHER): Payer: BLUE CROSS/BLUE SHIELD | Admitting: Family Medicine

## 2019-03-10 ENCOUNTER — Telehealth: Payer: Self-pay

## 2019-03-10 DIAGNOSIS — Z20822 Contact with and (suspected) exposure to covid-19: Secondary | ICD-10-CM

## 2019-03-10 DIAGNOSIS — Z20828 Contact with and (suspected) exposure to other viral communicable diseases: Secondary | ICD-10-CM

## 2019-03-10 NOTE — Progress Notes (Signed)
[facility] Telemedicine Visit  Patient consented to have virtual visit. Method of visit: Telephone  Encounter participants: Patient: Megan Atkins - located at home. Provider: Lovenia Kim - located at clinic. Others (if applicable): N/A  Chief Complaint: exposure to COVID-19   HPI: Patient reports her husband was exposed to [place] 2 weeks ago and did not get tested at that time, however was checked this past Friday and was confirmed positive. He is symptomatic with fever and cough and is currently quarantined.  Patient started feeling fatigue, cough and diarrhea on Monday and reports feeling very tired since yesterday. She has taken Tylenol to help with body aches which has not helped much. She denies sore throat, loss of sense of smell or taste.  No fever, chills or SOB.     ROS: per HPI  Pertinent PMHx: rheumatic heart disease    Exam:  General: 43 yo female, NAD  Respiratory: speaking in full sentences, comfortable work of breathing   Assessment/Plan:  Exposure to Covid-19 Virus Husband confirmed positive for COVID-19, warrants testing as patient symptomatic with cough, fatigue but no fever or shortness of breath.   -Staff message sent for drive-up COVID testing.  -Advised to self quarantine while she awaits test.  -ED precautions given     Time spent during visit with patient: 10 minutes  Lovenia Kim MD [place] PGY-3

## 2019-03-10 NOTE — Telephone Encounter (Signed)
Dr. Mingo Amber request COVID 19 test.

## 2019-03-11 LAB — NOVEL CORONAVIRUS, NAA: SARS-CoV-2, NAA: NOT DETECTED

## 2019-03-12 ENCOUNTER — Telehealth: Payer: Self-pay

## 2019-03-12 NOTE — Telephone Encounter (Signed)
Patient called nurse line requesting a copy of her negative Covid test. Placed up front for pick up. Pt instructed to wear a face covering.

## 2019-03-15 DIAGNOSIS — Z20822 Contact with and (suspected) exposure to covid-19: Secondary | ICD-10-CM | POA: Insufficient documentation

## 2019-03-15 DIAGNOSIS — Z20828 Contact with and (suspected) exposure to other viral communicable diseases: Secondary | ICD-10-CM | POA: Insufficient documentation

## 2019-03-15 NOTE — Assessment & Plan Note (Signed)
Husband confirmed positive for COVID-19, warrants testing as patient symptomatic with cough, fatigue but no fever or shortness of breath.   -Staff message sent for drive-up COVID testing.  -Advised to self quarantine while she awaits test.  -ED precautions given

## 2019-03-19 NOTE — Progress Notes (Signed)
Virtual Visit via Telephone Note   This visit type was conducted due to national recommendations for restrictions regarding the COVID-19 Pandemic (e.g. social distancing) in an effort to limit this patient's exposure and mitigate transmission in our community.  Due to her co-morbid illnesses, this patient is at least at moderate risk for complications without adequate follow up.  This format is felt to be most appropriate for this patient at this time.  The patient did not have access to video technology/had technical difficulties with video requiring transitioning to audio format only (telephone).  All issues noted in this document were discussed and addressed.  No physical exam could be performed with this format.  Please refer to the patient's chart for her  consent to telehealth for [facility].   Date:  03/23/2019   ID:  [name], DOB [birth year], MRN [id number]  Patient Location: Home Provider Location: Office  PCP:  Dickie La, MD  Cardiologist:   Johnsie Cancel Electrophysiologist:  None   Evaluation Performed:  Follow-Up Visit  Chief Complaint:  Rheumatic mitral valve disease  History of Present Illness:    43 y.o. with history of moderate MS and rheumatic mitral valve disease Last echo March 2018 reviewed EF 60-65% moderate MS mild MR Valve area by PT1/2 1.35 cm2  Moderate LAE estimated PA 39 mmHg mean gradient 9 mmHg peak 10 mmHg She has a history of palpitations but no documented PAF. Event monitor ordered in 2018 not completed by patient Husband recently tested positive for COVID but patient tested negative   She is asymptomatic and compliant with her meds   The patient  does not have symptoms concerning for COVID-19 infection (fever, chills, cough, or new shortness of breath).    Past Medical History:  Diagnosis Date  . Hemorrhoid 11/2012  . Mitral stenosis    last echo 05/2011 at [facility]  . Palpitations   . Rheumatic heart disease    Past Surgical History:   Procedure Laterality Date  . HEMORRHOID SURGERY N/A 11/18/2012   Procedure: HEMORRHOIDECTOMY;  Surgeon: Imogene Burn. Georgette Dover, MD;  Location: [facility];  Service: General;  Laterality: N/A;  . NO PAST SURGERIES       Current Meds  Medication Sig  . atenolol (TENORMIN) 25 MG tablet TAKE 1 TABLET BY MOUTH EVERY DAY  . azelastine (OPTIVAR) 0.05 % ophthalmic solution Place 1 drop into both eyes 2 (two) times daily as needed.  . clotrimazole-betamethasone (LOTRISONE) cream Apply 1 application topically 2 (two) times daily.  . fluticasone (FLONASE) 50 MCG/ACT nasal spray Place 2 sprays into both nostrils daily.  Marland Kitchen olopatadine (PATANOL) 0.1 % ophthalmic solution INSTILL 1 DROP INTO BOTH EYES TWICE A DAY     Allergies:   Patient has no known allergies.   Social History   Tobacco Use  . Smoking status: Never Smoker  . Smokeless tobacco: Never Used  Substance Use Topics  . Alcohol use: No  . Drug use: No     Family Hx: The patient's Family history is unknown by patient.  ROS:   Please see the history of present illness.     All other systems reviewed and are negative.   Prior CV studies:   The following studies were reviewed today:  Echo 01/03/17 see HPI  Labs/Other Tests and Data Reviewed:    EKG:  NSR rate 67 normal   Recent Labs: 07/16/2018: ALT 15; BUN 6; Creatinine, Ser 0.57; Hemoglobin 13.4; Platelets 208; Potassium 4.4; Sodium 139  Recent Lipid Panel Lab Results  Component Value Date/Time   CHOL 138 07/16/2018 03:21 PM   TRIG 101 07/16/2018 03:21 PM   HDL 40 07/16/2018 03:21 PM   CHOLHDL 3.5 07/16/2018 03:21 PM   LDLCALC 78 07/16/2018 03:21 PM    Wt Readings from Last 3 Encounters:  03/23/19 66.7 kg  07/16/18 67.4 kg  05/13/18 66.5 kg     Objective:    Vital Signs:  BP 110/65   Pulse 81   Ht 5\' 3"  (1.6 m)   Wt 66.7 kg   BMI 26.04 kg/m    Telephone visit no exam   ASSESSMENT & PLAN:    1. Mitral Stenosis:  Needs f/u echo continue beta  blocker to maximize diastolic filling time F/U echo ordered to assess EF and gradients   COVID-19 Education: The signs and symptoms of COVID-19 were discussed with the patient and how to seek care for testing (follow up with PCP or arrange E-visit).  The importance of social distancing was discussed today.  Time:   Today, I have spent 30 minutes with the patient with telehealth technology discussing the above problems.     Medication Adjustments/Labs and Tests Ordered: Current medicines are reviewed at length with the patient today.  Concerns regarding medicines are outlined above.   Tests Ordered:  Echo for rheumatic mitral stenosis   Medication Changes:  None   Disposition:  Follow up in a year if echo stable  Signed, Jenkins Rouge, MD  03/23/2019 10:37 AM    [place]

## 2019-03-23 ENCOUNTER — Telehealth (INDEPENDENT_AMBULATORY_CARE_PROVIDER_SITE_OTHER): Payer: Self-pay | Admitting: Cardiovascular Disease

## 2019-03-23 ENCOUNTER — Other Ambulatory Visit: Payer: Self-pay

## 2019-03-23 ENCOUNTER — Encounter: Payer: Self-pay | Admitting: Cardiovascular Disease

## 2019-03-23 VITALS — BP 110/65 | HR 81 | Ht 63.0 in | Wt 147.0 lb

## 2019-03-23 DIAGNOSIS — I050 Rheumatic mitral stenosis: Secondary | ICD-10-CM

## 2019-03-23 MED ORDER — ATENOLOL 25 MG PO TABS: 25.0000 mg | ORAL_TABLET | Freq: Every day | ORAL | 3 refills | Status: DC

## 2019-03-23 NOTE — Patient Instructions (Addendum)
Medication Instructions:   If you need a refill on your cardiac medications before your next appointment, please call your pharmacy.   Lab work:  If you have labs (blood work) drawn today and your tests are completely normal, you will receive your results only by: Marland Kitchen MyChart Message (if you have MyChart) OR . A paper copy in the mail If you have any lab test that is abnormal or we need to change your treatment, we will call you to review the results.  Testing/Procedures: Your physician has requested that you have an echocardiogram. Echocardiography is a painless test that uses sound waves to create images of your heart. It provides your doctor with information about the size and shape of your heart and how well your heart's chambers and valves are working. This procedure takes approximately one hour. There are no restrictions for this procedure.  Follow-Up: At [facility], you and your health needs are our priority.  As part of our continuing mission to provide you with exceptional heart care, we have created designated Provider Care Teams.  These Care Teams include your primary Cardiologist (physician) and Advanced Practice Providers (APPs -  Physician Assistants and Nurse Practitioners) who all work together to provide you with the care you need, when you need it. You will need a follow up appointment in 6 months.  Please call our office 2 months in advance to schedule this appointment.  You may see Jenkins Rouge, MD or one of the following Advanced Practice Providers on your designated Care Team:   Truitt Merle, NP Cecilie Kicks, NP . Kathyrn Drown, NP

## 2019-03-31 ENCOUNTER — Telehealth (HOSPITAL_COMMUNITY): Payer: Self-pay | Admitting: Radiology

## 2019-03-31 NOTE — Telephone Encounter (Signed)

## 2019-04-01 ENCOUNTER — Telehealth: Payer: Self-pay | Admitting: Cardiovascular Disease

## 2019-04-01 ENCOUNTER — Other Ambulatory Visit: Payer: Self-pay

## 2019-04-01 ENCOUNTER — Ambulatory Visit (HOSPITAL_COMMUNITY): Payer: BLUE CROSS/BLUE SHIELD | Attending: Cardiology

## 2019-04-01 DIAGNOSIS — I050 Rheumatic mitral stenosis: Secondary | ICD-10-CM | POA: Diagnosis not present

## 2019-04-01 NOTE — Telephone Encounter (Signed)
Left message for patient to call back twice. Will try to have patient see Robbie Lis PA tomorrow if she calls back.

## 2019-04-01 NOTE — Telephone Encounter (Signed)
Called patient again. Patient stated she is feeling fine now. That she had palpitations over the weekend and said she does not feel like she needs to see anyone. Patient stated she would call back if she has palpitations again.

## 2019-04-01 NOTE — Telephone Encounter (Signed)
Patient wanted Dr. Johnsie Cancel to know that she has been having off and on "palpitations" for about a week. It lasts for seconds.  She is concerned and wants to know what to do?

## 2019-04-08 ENCOUNTER — Telehealth: Payer: Self-pay | Admitting: Nurse Practitioner

## 2019-04-08 DIAGNOSIS — I050 Rheumatic mitral stenosis: Secondary | ICD-10-CM

## 2019-04-08 DIAGNOSIS — H401131 Primary open-angle glaucoma, bilateral, mild stage: Secondary | ICD-10-CM | POA: Diagnosis not present

## 2019-04-08 DIAGNOSIS — R002 Palpitations: Secondary | ICD-10-CM

## 2019-04-08 MED ORDER — ATENOLOL 50 MG PO TABS: 50.0000 mg | ORAL_TABLET | Freq: Every day | ORAL | 3 refills | Status: DC

## 2019-04-08 NOTE — Telephone Encounter (Signed)
Left message for patient to call back for advice from Dr. Johnsie Cancel

## 2019-04-08 NOTE — Telephone Encounter (Signed)
-----   Message from [name], MD sent at 04/06/2019  9:22 AM EDT ----- MS is severe moderate last year but gradients are similar Continue beta blocker and diuretic if no symptoms f/u in 6 months if symptoms f/u with me to discuss right and left heart cath

## 2019-04-08 NOTE — Telephone Encounter (Signed)
Orders placed in epic Attempted to call patient again, no answer

## 2019-04-08 NOTE — Telephone Encounter (Signed)
Increase atenolol to 50 mg and order 2 week Zio to r/o PAF in patient with significant mitral stenosis f/u with me after monitor results in person

## 2019-04-08 NOTE — Telephone Encounter (Signed)
Reviewed results of echo with patient. She c/o frequent palpitations and a feeling of discomfort in her throat that accompanies the palps. She denies chest pain and SOB. Her only cardiac medication is atenolol which she takes as directed. There is no record of a diuretic in her medication list and she is not aware of ever having taken one. I advised that I will review her symptoms with Dr. Johnsie Cancel and call her back with his advice. She verbalized understanding and agreement and thanked me for the call.

## 2019-04-09 ENCOUNTER — Telehealth: Payer: Self-pay | Admitting: Radiology

## 2019-04-09 ENCOUNTER — Other Ambulatory Visit: Payer: Self-pay

## 2019-04-09 DIAGNOSIS — I480 Paroxysmal atrial fibrillation: Secondary | ICD-10-CM

## 2019-04-09 NOTE — Telephone Encounter (Signed)
Lpmtcb 7/2

## 2019-04-09 NOTE — Telephone Encounter (Signed)
I spoke to the patient with Dr Kyla Balzarine recommendations.  She verbalized understanding.

## 2019-04-09 NOTE — Telephone Encounter (Signed)
Enrolled patient for a 14 day Zio monitor to be mailed. Brief instructions were gone over with the patient and she knows to expect the monitor to arrive in the next 3-4 days

## 2019-04-16 ENCOUNTER — Other Ambulatory Visit (INDEPENDENT_AMBULATORY_CARE_PROVIDER_SITE_OTHER): Payer: BLUE CROSS/BLUE SHIELD

## 2019-04-16 DIAGNOSIS — I480 Paroxysmal atrial fibrillation: Secondary | ICD-10-CM | POA: Diagnosis not present

## 2019-05-01 ENCOUNTER — Other Ambulatory Visit: Payer: Self-pay

## 2019-05-01 MED ORDER — FLUTICASONE PROPIONATE 50 MCG/ACT NA SUSP: 2.0000 | Freq: Every day | NASAL | 6 refills | Status: DC

## 2019-05-11 DIAGNOSIS — R002 Palpitations: Secondary | ICD-10-CM | POA: Diagnosis not present

## 2019-05-12 ENCOUNTER — Other Ambulatory Visit: Payer: Self-pay

## 2019-05-22 ENCOUNTER — Other Ambulatory Visit: Payer: Self-pay

## 2019-05-26 MED ORDER — AZELASTINE HCL 0.05 % OP SOLN: 1.0000 [drp] | Freq: Two times a day (BID) | OPHTHALMIC | 12 refills | Status: DC | PRN

## 2019-06-02 ENCOUNTER — Encounter: Payer: Self-pay | Admitting: Nurse Practitioner

## 2019-06-18 ENCOUNTER — Telehealth: Payer: Self-pay | Admitting: Cardiovascular Disease

## 2019-06-18 DIAGNOSIS — I480 Paroxysmal atrial fibrillation: Secondary | ICD-10-CM

## 2019-06-18 NOTE — Telephone Encounter (Signed)
Spoke with the pt re: her monitor results and she agrees to an EP consult. Will put in the referral.

## 2019-06-18 NOTE — Telephone Encounter (Signed)
  Patient is calling monitor results

## 2019-07-02 ENCOUNTER — Other Ambulatory Visit: Payer: Self-pay | Admitting: *Deleted

## 2019-07-03 MED ORDER — CLOTRIMAZOLE-BETAMETHASONE 1-0.05 % EX CREA: 1.0000 "application " | TOPICAL_CREAM | Freq: Two times a day (BID) | CUTANEOUS | 0 refills | Status: DC

## 2019-08-28 DIAGNOSIS — H401131 Primary open-angle glaucoma, bilateral, mild stage: Secondary | ICD-10-CM | POA: Diagnosis not present

## 2019-11-06 ENCOUNTER — Other Ambulatory Visit: Payer: Self-pay

## 2019-11-06 ENCOUNTER — Ambulatory Visit (INDEPENDENT_AMBULATORY_CARE_PROVIDER_SITE_OTHER): Payer: BLUE CROSS/BLUE SHIELD | Admitting: Family Medicine

## 2019-11-06 DIAGNOSIS — Z889 Allergy status to unspecified drugs, medicaments and biological substances status: Secondary | ICD-10-CM

## 2019-11-06 DIAGNOSIS — M25571 Pain in right ankle and joints of right foot: Secondary | ICD-10-CM | POA: Diagnosis not present

## 2019-11-06 DIAGNOSIS — G8929 Other chronic pain: Secondary | ICD-10-CM | POA: Diagnosis not present

## 2019-11-06 MED ORDER — DICLOFENAC SODIUM 1 % EX GEL: 2.0000 g | Freq: Four times a day (QID) | CUTANEOUS | 3 refills | Status: DC

## 2019-11-06 MED ORDER — ANKLE KNITTED COMPRESSION MISC: 1.0000 [IU] | Freq: Every day | 0 refills | Status: DC

## 2019-11-06 NOTE — Assessment & Plan Note (Addendum)
Patient has been experiencing ankle pain on and off that is exacerbated specifically by cold weather.  She does report swelling after consistent use at work.  Today, discussed obtaining x-ray.  Shared decision making - decided to use compression stocking, RICE, and Voltaren gel and OTC antiinflammatories as needed for pain at this time and continue exercises at home. Highest on the differential is tibialis anterior tendinitis given site and overuse. Given chronicity, can consider arthritis, especially with overuse symptoms, though she is young. Unlikely to be an acute injury given no recent trauma. Patient denies hx of blood clots, erythema and pale or hot extremity making DVT unlikely cause of swelling. Patient to follow-up as needed. If continued pain and no improvement with conservative measures, can consider Xray (which patient also agrees with) to rule out arthritis as this would change management -Compression stocking and Voltaren gel sent to pharmacy

## 2019-11-06 NOTE — Assessment & Plan Note (Signed)
Patient reports that she has recently started getting a rash on her hands that causes redness, small bumps and small blisters.  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  This is never happened previously.  It has been present for 2 to 3 weeks.  Patient to avoid hand sanitizer at work.  She can try other hand sanitizers from the store, but likely may come across the same response.  Patient instructed to wash hands in and out of every room and moisturize.

## 2019-11-06 NOTE — Patient Instructions (Addendum)
Dear Megan Atkins,   It was good to see you! Thank you for taking your time to come in to be seen. Today, we discussed the following:   Ankle Pain (with swelling)  Use voltaren gel at work to help when painful   Use ankle compression stocking to help with swelling while at work   You can also try over the counter medications to help with pain. Alternating tylenol and ibuprofen can be very helpful for pain   Continue stretching and exercises  If you continue to have the same problem, please come back and we can get x rays   Hand Itching  . Stop using hand sanitizer at work. You can try a different brand of hand sanitizer if you find something at the store.  There is also a chance that you are allergic to the alcohol base component to the hand sanitizer.  If this is the case, continue to wash your hands and wear gloves at work.  Make sure to moisturize well if you are washing your hands in and out of every patient room.  Be well,   Zettie Cooley, M.D   [facility] [phone]  *Sign up for MyChart for instant access to your health profile, labs, orders, upcoming appointments or to contact your provider with questions*  ===================================================================================

## 2019-11-06 NOTE — Progress Notes (Signed)
  Subjective  Megan Atkins is a 44 y.o. female who presents today with the following problems:  Ankle Pain Previously saw Dr. Georgena Spurling for ankle pain a few years ago.  At that time, she was given exercises to do.  Dr. Silvio Clayman thought this was arthritis.  She reports that her ankle pain has been on and off since that time.  It is mostly exacerbated with cold weather.  She reports that she does get swelling when she is at work, but it has gone away by the time she wakes up in the morning.  She denies any recent traumas or any history of trauma to that area.  Itching Hands Small bumps and redness on hand. Sometimes a blister forms on her fingers. Has been present for 2-3 weeks, but has never occurred before.   Objective  Physical Exam BP 100/72   Pulse 63   Wt 147 lb 3.2 oz (66.8 kg)   LMP 10/26/2019 (Exact Date)   SpO2 98%   BMI 26.08 kg/m  No tenderness to palpation of ankle joint.  Full active and passive range of motion.  5 out of 5 strength with flexion, extension, inversion and eversion.  Assessment & Plan    Problem List Items Addressed This Visit      Active Problems   Right ankle pain    Patient has been experiencing ankle pain on and off that is exacerbated specifically by cold weather.  She does report swelling after consistent use at work.  Today, discussed obtaining x-ray.  Shared decision making - decided to use compression stocking, RICE, and Voltaren gel and OTC antiinflammatories as needed for pain at this time and continue exercises at home. Highest on the differential is tibialis anterior tendinitis given site and overuse. Given chronicity, can consider arthritis, especially with overuse symptoms, though she is young. Unlikely to be an acute injury given no recent trauma. Patient denies hx of blood clots, erythema and pale or hot extremity making DVT unlikely cause of swelling. Patient to follow-up as needed. If continued pain and no improvement with conservative measures, can  consider Xray (which patient also agrees with) to rule out arthritis as this would change management -Compression stocking and Voltaren gel sent to pharmacy      Allergy status to unspecified drugs, medicaments and biological substances    Patient reports that she has recently started getting a rash on her hands that causes redness, small bumps and small blisters.  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  This is never happened previously.  It has been present for 2 to 3 weeks.  Patient to avoid hand sanitizer at work.  She can try other hand sanitizers from the store, but likely may come across the same response.  Patient instructed to wash hands in and out of every room and moisturize.        Wilber Oliphant, M.D.  11:11 AM 11/06/2019

## 2020-05-15 ENCOUNTER — Other Ambulatory Visit: Payer: Self-pay | Admitting: Cardiovascular Disease

## 2020-06-08 ENCOUNTER — Other Ambulatory Visit: Payer: Self-pay | Admitting: Cardiovascular Disease

## 2020-06-26 ENCOUNTER — Other Ambulatory Visit: Payer: Self-pay | Admitting: Cardiovascular Disease

## 2020-07-20 NOTE — Progress Notes (Signed)
Date:  07/29/2020   ID:  Megan Atkins, Megan Atkins Sep 11, 1976, MRN [id number]   PCP:  Lenoria Chime, MD  Cardiologist:   Johnsie Cancel Electrophysiologist:  None   Evaluation Performed:  Follow-Up Visit  Chief Complaint:  Rheumatic mitral valve disease  History of Present Illness:    44 y.o. with history of moderate MS and rheumatic mitral valve disease Echo March 2018 reviewed EF 60-65% moderate MS mild MR Valve area by PT1/2 1.35 cm2  Moderate LAE estimated PA 39 mmHg mean gradient 9 mmHg peak 10 mmHg She has a history of palpitations but no documented PAF. Event monitor ordered in 2018 not completed by patient June 2020 husband tested positive for COVID but patient tested negative   She is asymptomatic and compliant with her meds   Echo 04/01/19 severe LAE normal EF 60-65% gradients similar mean 8 MVA 1.2 cm2 with mild MR and estimated PA pressure 46 mmHg   Atenolol dose increased to 50 mg daily July 2020   Long term monitor 05/12/19 with no PAF had PAC/PVC;s <1% beats And one run of AVNRT rates 160 was supposed to see EP but never did  She gets occasional sporadic palpitations  Occasional chest pian sounds muscular but with exertion    Has been vaccinated She and husband have had COVID    Past Medical History:  Diagnosis Date  . Hemorrhoid 11/2012  . Mitral stenosis    last echo 05/2011 at [facility]  . Palpitations   . Rheumatic heart disease    Past Surgical History:  Procedure Laterality Date  . HEMORRHOID SURGERY N/A 11/18/2012   Procedure: HEMORRHOIDECTOMY;  Surgeon: Imogene Burn. Georgette Dover, MD;  Location: [place];  Service: General;  Laterality: N/A;  . NO PAST SURGERIES       Current Meds  Medication Sig  . atenolol (TENORMIN) 50 MG tablet Take 1 tablet (50 mg total) by mouth daily. Please keep upcoming appt in October before anymore refills. Thank you  . azelastine (OPTIVAR) 0.05 % ophthalmic solution Place 1 drop into both eyes 2 (two) times daily as  needed.  . clotrimazole-betamethasone (LOTRISONE) cream Apply 1 application topically 2 (two) times daily.  . diclofenac Sodium (VOLTAREN) 1 % GEL Apply 2 g topically 4 (four) times daily.  Water engineer Bandages & Supports (ANKLE KNITTED COMPRESSION) MISC 1 Units by Does not apply route daily.  . fluticasone (FLONASE) 50 MCG/ACT nasal spray Place 2 sprays into both nostrils daily.  Marland Kitchen olopatadine (PATANOL) 0.1 % ophthalmic solution INSTILL 1 DROP INTO BOTH EYES TWICE A DAY     Allergies:   Patient has no known allergies.   Social History   Tobacco Use  . Smoking status: Never Smoker  . Smokeless tobacco: Never Used  Substance Use Topics  . Alcohol use: No  . Drug use: No     Family Hx: The patient's Family history is unknown by patient.  ROS:   Please see the history of present illness.     All other systems reviewed and are negative.   Prior CV studies:   The following studies were reviewed today:  Echo 01/03/17 see HPI Echo 04/01/19  Monitor 05/12/19   Labs/Other Tests and Data Reviewed:    EKG:  NSR rate 67 normal 07/28/20 SR ICRBBB rate 70 LAE otherwise normal   Recent Labs: No results found for requested labs within last 8760 hours.   Recent Lipid Panel Lab Results  Component Value Date/Time   CHOL 138  07/16/2018 03:21 PM   TRIG 101 07/16/2018 03:21 PM   HDL 40 07/16/2018 03:21 PM   CHOLHDL 3.5 07/16/2018 03:21 PM   LDLCALC 78 07/16/2018 03:21 PM    Wt Readings from Last 3 Encounters:  07/29/20 148 lb (67.1 kg)  11/06/19 147 lb 3.2 oz (66.8 kg)  03/23/19 147 lb (66.7 kg)     Objective:    Vital Signs:  BP 110/62   Pulse 70   Ht 5\' 3"  (1.6 m)   Wt 148 lb (67.1 kg)   SpO2 97%   BMI 26.22 kg/m    Affect appropriate Healthy:  appears stated age HEENT: normal Neck supple with no adenopathy JVP normal no bruits no thyromegaly Lungs clear with no wheezing and good diaphragmatic motion Heart:  S1/S2 no murmur, no rub, gallop or click PMI  normal Abdomen: benighn, BS positve, no tenderness, no AAA no bruit.  No HSM or HJR Distal pulses intact with no bruits No edema Neuro non-focal Skin warm and dry No muscular weakness   ASSESSMENT & PLAN:    1. Mitral Stenosis:  She has been asymptomatic without PAF or severe elevation in estimated PA pressures by echo has been on beta blockers will update echo Long talk about trying to avoid premature MVR at her age she would need atleast 2 operations. Indications for surgery including symptoms, PAF, worsening pulmonary HTN or co morbid cardiac disease discussed. She has had some SSCP will order ETT to risk stratify and also estimate her functional status   COVID-19 Education: The signs and symptoms of COVID-19 were discussed with the patient and how to seek care for testing (follow up with PCP or arrange E-visit).  The importance of social distancing was discussed today.  Time:   Today, I have spent 30 minutes with the patient with telehealth technology discussing the above problems.     Medication Adjustments/Labs and Tests Ordered: Current medicines are reviewed at length with the patient today.  Concerns regarding medicines are outlined above.   Tests Ordered:  ETT TTE   Medication Changes:  None   Disposition:  Follow up  In 6 months if ETT / TTE ok   Signed, Jenkins Rouge, MD  07/29/2020 3:36 PM    [facility]

## 2020-07-24 ENCOUNTER — Other Ambulatory Visit: Payer: Self-pay | Admitting: Cardiovascular Disease

## 2020-07-29 ENCOUNTER — Encounter: Payer: Self-pay | Admitting: Cardiovascular Disease

## 2020-07-29 ENCOUNTER — Ambulatory Visit: Payer: PRIVATE HEALTH INSURANCE | Admitting: Cardiovascular Disease

## 2020-07-29 ENCOUNTER — Other Ambulatory Visit: Payer: Self-pay

## 2020-07-29 VITALS — BP 110/62 | HR 70 | Ht 63.0 in | Wt 148.0 lb

## 2020-07-29 DIAGNOSIS — I480 Paroxysmal atrial fibrillation: Secondary | ICD-10-CM

## 2020-07-29 DIAGNOSIS — I050 Rheumatic mitral stenosis: Secondary | ICD-10-CM | POA: Diagnosis not present

## 2020-07-29 DIAGNOSIS — R079 Chest pain, unspecified: Secondary | ICD-10-CM

## 2020-07-29 DIAGNOSIS — R002 Palpitations: Secondary | ICD-10-CM

## 2020-07-29 MED ORDER — ATENOLOL 50 MG PO TABS
50.0000 mg | ORAL_TABLET | Freq: Every day | ORAL | 3 refills | Status: DC
Start: 2020-07-29 — End: 2021-08-28

## 2020-07-29 NOTE — Patient Instructions (Addendum)
Medication Instructions:  Your physician recommends that you continue on your current medications as directed. Please refer to the Current Medication list given to you today.  *If you need a refill on your cardiac medications before your next appointment, please call your pharmacy*   Lab Work: If you have labs (blood work) drawn today and your tests are completely normal, you will receive your results only by: Marland Kitchen MyChart Message (if you have MyChart) OR . A paper copy in the mail If you have any lab test that is abnormal or we need to change your treatment, we will call you to review the results.   Testing/Procedures: Your physician has requested that you have an echocardiogram. Echocardiography is a painless test that uses sound waves to create images of your heart. It provides your doctor with information about the size and shape of your heart and how well your heart's chambers and valves are working. This procedure takes approximately one hour. There are no restrictions for this procedure.  Your physician has requested that you have an exercise tolerance test. For further information please visit [email or web address]. Please also follow instruction sheet, as given.  Follow-Up: At [facility], you and your health needs are our priority.  As part of our continuing mission to provide you with exceptional heart care, we have created designated Provider Care Teams.  These Care Teams include your primary Cardiologist (physician) and Advanced Practice Providers (APPs -  Physician Assistants and Nurse Practitioners) who all work together to provide you with the care you need, when you need it.  We recommend signing up for the patient portal called "MyChart".  Sign up information is provided on this After Visit Summary.  MyChart is used to connect with patients for Virtual Visits (Telemedicine).  Patients are able to view lab/test results, encounter notes, upcoming appointments, etc.  Non-urgent  messages can be sent to your provider as well.   To learn more about what you can do with MyChart, go to [email or web address].    Your next appointment:   1 year(s)  The format for your next appointment:   In Person  Provider:   You may see Jenkins Rouge, MD or one of the following Advanced Practice Providers on your designated Care Team:    Truitt Merle, NP  Cecilie Kicks, NP  Kathyrn Drown, NP

## 2020-08-01 ENCOUNTER — Encounter: Payer: PRIVATE HEALTH INSURANCE | Admitting: Family Medicine

## 2020-08-01 NOTE — Progress Notes (Deleted)
    SUBJECTIVE:   CHIEF COMPLAINT / HPI:  Ms Kehoe is a pleasant 44 yo female who presents for yearly physical exam.  Concerns today:  PMH-  Rheumatic heart disease with mitral stenosis: follows with cardiologist, saw on 07/29/20 and is repeating ECHO.  Has some palpitations and currently taking B-blocker. Heart failure with preserved EF- repeating echo as above. PPD positive in the past- ?? Treated??  Past surgical hx: hemorroidectomy  Past social history: Never smoker Alcohol use: Other substance use: Living situation:  Family history- any family history of breast cancer?  Preventative Health [x]  Pap smear (q3 yrs, 21-65 y/o): 07/2018 NILM, HPV negative [***] Mammogram (q2 yrs, 89-74 y/o): *** [DUE] DM II (q3 yrs, asx w/ BP >135/80 or age 73-70 overweight/obesity): recommend screening A1c [x]  Lipid panel- 07/2018, 0.4% ASVD 10-yr risk [DUE] Hep C screening (born between '45 and '65, or adults ages 18-79): *** [x]  HIV (universal once, repeat if risk factors): negative 07/2018 [DUE] Flu vaccine (q1 yr): *** [***] Tdap (q10 yrs, if rusty object & >5yo repeat): *** [***] COVID vaccinations [N/A]  Smoking cessation: never smoker [***]  Domestic violence screen: *** [***] Advanced directives: *** [***] Skin exam: ***  OBJECTIVE:   There were no vitals taken for this visit.  General: A&O, NAD HEENT: No sign of trauma, EOM grossly intact Cardiac: RRR, no m/r/g Respiratory: CTAB, normal WOB, no w/c/r GI: Soft, NTTP, non-distended  Extremities: NTTP, no peripheral edema. Neuro: Normal gait, moves all four extremities appropriately. Psych: Appropriate mood and affect   ASSESSMENT/PLAN:   No problem-specific Assessment & Plan notes found for this encounter.     Lenoria Chime, MD [place]

## 2020-08-26 ENCOUNTER — Other Ambulatory Visit (HOSPITAL_COMMUNITY): Payer: PRIVATE HEALTH INSURANCE

## 2020-08-30 ENCOUNTER — Other Ambulatory Visit (HOSPITAL_COMMUNITY): Payer: PRIVATE HEALTH INSURANCE

## 2020-10-31 NOTE — Progress Notes (Signed)
SUBJECTIVE:   CHIEF COMPLAINT / HPI:   45 yo female presents for follow-up and to meet new doctor.  Concerns: R knee pain- In last two weeks with weather change more pain in, also some. Last two weeks has been having some right knee pain and sometimes up to right hip. No trip, fall, or trauma. No redness or swelling of right knee. No surgeries or injections to knee.  R undereye swelling- saw eye doctor yearly, currently on timolol drops but not sure why, was told maybe would have glaucoma in the future. Coworkers at work have noticed swelling but gets better throughout the day. No redness, no pain with eye movement, no loss of vision, no facial drooping, no history of headaches, no sinus pain or drainage, no eye drainage, no fevers, or chills. Optometrist did not know what it was. Has tried ice/heat over eye and doesn't help. Has been present for three months, not getting better or worse. No headaches. Sometimes itchy. No trouble eating or swallowing.  PMH Rheumatic heart disease/heart failure with preserved ejection fraction- last ECHO 03/2019 with EF 60-65%, mildly elevated RV pressure, left atria severely dilated, rheumatic mitral valve with severe stenosis and mild mitral regurgitation, mildly elevated pulmonary pressure. Cardiologist Dr Jenkins Rouge, saw on 07/2020, ordered repeat echo and stress test to stratify her functional status, she just had today. Denies syncope, leg swelling, chest pain, dyspnea on exertion.  Palpitations- sporadic. Had monitor 05/11/2019 with no PAF and PAC/PVCs <1% of beats. Was supposed to see EP but hasn't yet. On atenolol 50mg  daily. Sometimes gets them twice a week, sometimes goes several weeks without getting. Thinks the higher dose of atenolol makes her feel them less often. Sometimes the palpitations will make her cough, can happen with exertion or rest.  Surgical Hx Hemorrhoid surgery  Allergies NKDA  Social History: Tobacco never smoker Alcohol  none Other Substance Use: none Work: Works as an Clinical biochemist at [employer], was a Quarry manager prior to that Home life: husband, son and daughter 25 yo and 56 yo.   PERTINENT  PMH / PSH: as above  OBJECTIVE:   BP 98/62    Pulse 74    Wt 145 lb (65.8 kg)    LMP 10/22/2020    SpO2 99%    BMI 25.69 kg/m   General: A&O, NAD HEENT: No sign of trauma, PERRL, EOMI, no scleral injection, mild swelling under R eye without palpated mass, induration or fluctuance, no rash on skin or erythema, no eye discharge, nares with swollen turbinates bilaterally with bluish hue, no sinus TTP no sinus drainage appreciated, bilateral TM clear without effusion Cardiac: RRR, no m/r/g Respiratory: CTAB, normal WOB, no w/c/r GI: Soft, NTTP, non-distended  Extremities: NTTP, no peripheral edema. Neuro: Normal gait, moves all four extremities appropriately. CN II-XII intact, no facial droop, sensation intact bilateral upper and lower face. MSK: Right knee without effusion, without tenderness to palpation, without redness or erythema. Negative Lachman's McMurrays, anterior and posterior drawer test. No laxity of pain with valgus/varus tension. No edema of right leg or calf pain. Strength and sensation 5/5 in bilateral lower extremities. Negative pain with leg roll of right hip. FADER negative. Lumbar spine without erythema, induration, or rash. Non-tender lumbar spine to palpation, paraspinal musculature non-tender to palpation.  Psych: Appropriate mood and affect   ASSESSMENT/PLAN:   Eye swelling, right - no signs of infection, no red flag signs/sx, mild under eye swelling noted on R compared to left on exam,  CN II-XII intact, no facial dropping - will start daily allergy medicine in case of allergic component with itching - continue eye drops per optometrist- not sure why on timolol - will refer to optho for further evaluation after shared decision making with patient and due to chronicity - continue flonase as well for  allergic rhinitis  Palpitations - continue atenolol 50mg  daily, follows with cardiology, recent stress test and ECHO today results pending  Right knee pain - for past 2 weeks, normal exam, no concern for fracture or meniscal pathology, discussed quad/hamstring exercises, discussed red flag signs/symptoms, PRN Voltaren gel, will call if not improving - hip exam also without signs of pathology or pain able to be elicited   She states she does have all her COVID vaccines, will bring card to next appointment.  Tdap given today.  Lenoria Chime, MD [place]

## 2020-11-01 ENCOUNTER — Other Ambulatory Visit (HOSPITAL_COMMUNITY)
Admission: RE | Admit: 2020-11-01 | Discharge: 2020-11-01 | Disposition: A | Discharge status: Home / Self Care | Payer: PRIVATE HEALTH INSURANCE | Source: Ambulatory Visit | Attending: Cardiovascular Disease | Admitting: Cardiovascular Disease

## 2020-11-01 ENCOUNTER — Other Ambulatory Visit: Payer: Self-pay | Admitting: *Deleted

## 2020-11-01 DIAGNOSIS — Z20822 Contact with and (suspected) exposure to covid-19: Secondary | ICD-10-CM | POA: Insufficient documentation

## 2020-11-01 DIAGNOSIS — R079 Chest pain, unspecified: Secondary | ICD-10-CM

## 2020-11-01 DIAGNOSIS — Z01812 Encounter for preprocedural laboratory examination: Secondary | ICD-10-CM | POA: Diagnosis not present

## 2020-11-01 NOTE — Progress Notes (Signed)
Order for consent, please sign.

## 2020-11-02 LAB — SARS CORONAVIRUS 2 (TAT 6-24 HRS): SARS Coronavirus 2: NEGATIVE

## 2020-11-04 ENCOUNTER — Ambulatory Visit (HOSPITAL_COMMUNITY): Payer: PRIVATE HEALTH INSURANCE | Attending: Cardiology

## 2020-11-04 ENCOUNTER — Ambulatory Visit: Payer: PRIVATE HEALTH INSURANCE | Admitting: Family Medicine

## 2020-11-04 ENCOUNTER — Ambulatory Visit (INDEPENDENT_AMBULATORY_CARE_PROVIDER_SITE_OTHER): Payer: PRIVATE HEALTH INSURANCE

## 2020-11-04 ENCOUNTER — Other Ambulatory Visit: Payer: Self-pay

## 2020-11-04 ENCOUNTER — Encounter: Payer: Self-pay | Admitting: Family Medicine

## 2020-11-04 VITALS — BP 98/62 | HR 74 | Wt 145.0 lb

## 2020-11-04 DIAGNOSIS — Z23 Encounter for immunization: Secondary | ICD-10-CM | POA: Diagnosis not present

## 2020-11-04 DIAGNOSIS — I050 Rheumatic mitral stenosis: Secondary | ICD-10-CM | POA: Diagnosis not present

## 2020-11-04 DIAGNOSIS — Z1159 Encounter for screening for other viral diseases: Secondary | ICD-10-CM | POA: Insufficient documentation

## 2020-11-04 DIAGNOSIS — I480 Paroxysmal atrial fibrillation: Secondary | ICD-10-CM | POA: Insufficient documentation

## 2020-11-04 DIAGNOSIS — H5789 Other specified disorders of eye and adnexa: Secondary | ICD-10-CM | POA: Diagnosis not present

## 2020-11-04 DIAGNOSIS — R079 Chest pain, unspecified: Secondary | ICD-10-CM

## 2020-11-04 DIAGNOSIS — R002 Palpitations: Secondary | ICD-10-CM

## 2020-11-04 DIAGNOSIS — M25561 Pain in right knee: Secondary | ICD-10-CM | POA: Diagnosis not present

## 2020-11-04 LAB — EXERCISE TOLERANCE TEST
Estimated workload: 7.1 METS
Exercise duration (min): 6 min
Exercise duration (sec): 18 s
MPHR: 176 {beats}/min
Peak HR: 118 {beats}/min
Percent HR: 67 %
RPE: 17
Rest HR: 67 {beats}/min

## 2020-11-04 LAB — ECHOCARDIOGRAM COMPLETE
Area-P 1/2: 1.52 cm2
MV VTI: 0.82 cm2
S' Lateral: 2.6 cm

## 2020-11-04 MED ORDER — LEVOCETIRIZINE DIHYDROCHLORIDE 5 MG PO TABS: 5.0000 mg | ORAL_TABLET | Freq: Every evening | ORAL | 2 refills | Status: DC

## 2020-11-04 MED ORDER — DICLOFENAC SODIUM 1 % EX GEL
2.0000 g | Freq: Four times a day (QID) | CUTANEOUS | 3 refills | Status: DC
Start: 2020-11-04 — End: 2022-05-08

## 2020-11-04 NOTE — Assessment & Plan Note (Addendum)
-   no signs of infection, no red flag signs/sx, mild under eye swelling noted on R compared to left on exam, CN II-XII intact, no facial dropping - will start daily allergy medicine in case of allergic component with itching - continue eye drops per optometrist- not sure why on timolol - will refer to optho for further evaluation after shared decision making with patient and due to chronicity - continue flonase as well for allergic rhinitis

## 2020-11-04 NOTE — Patient Instructions (Addendum)
It was wonderful to see you today.  Please bring ALL of your medications with you to every visit.   Today we talked about:  - Tdap given today - Hep C ab checked today, will call with results - Referral to optho for eyelid swelling, continue your drops from your eye doctor and we will start a daily allergy medicine Xyzal 5mg  daily - Can continue to use Voltaren gel on right knee and back, if pain not improving or worsening give [place] a call and let us know - ER precautions: pain with eye movement, swelling/redness of eye, pus/eye drainage, loss of vision, changes in vision  Thank you for choosing [place].   Please call [phone] with any questions about today's appointment.  Please be sure to schedule follow up at the front  desk before you leave today.   Yehuda Savannah, MD  Family Medicine

## 2020-11-04 NOTE — Assessment & Plan Note (Signed)
-   continue atenolol 50mg  daily, follows with cardiology, recent stress test and ECHO today results pending

## 2020-11-04 NOTE — Assessment & Plan Note (Addendum)
-   for past 2 weeks, normal exam, no concern for fracture or meniscal pathology, discussed quad/hamstring exercises, discussed red flag signs/symptoms, PRN Voltaren gel, will call if not improving - hip exam also without signs of pathology or pain able to be elicited

## 2020-11-04 NOTE — Assessment & Plan Note (Signed)
-   ordered, patient states will come back to get

## 2021-01-30 ENCOUNTER — Other Ambulatory Visit: Payer: Self-pay | Admitting: Family Medicine

## 2021-02-10 ENCOUNTER — Ambulatory Visit: Payer: PRIVATE HEALTH INSURANCE | Admitting: Family Medicine

## 2021-03-24 ENCOUNTER — Other Ambulatory Visit: Payer: Self-pay

## 2021-03-24 ENCOUNTER — Encounter: Payer: Self-pay | Admitting: Family Medicine

## 2021-03-24 ENCOUNTER — Ambulatory Visit: Payer: PRIVATE HEALTH INSURANCE | Admitting: Family Medicine

## 2021-03-24 VITALS — BP 96/66 | HR 74 | Ht 63.0 in | Wt 149.2 lb

## 2021-03-24 DIAGNOSIS — H5789 Other specified disorders of eye and adnexa: Secondary | ICD-10-CM | POA: Diagnosis not present

## 2021-03-24 DIAGNOSIS — M25561 Pain in right knee: Secondary | ICD-10-CM

## 2021-03-24 DIAGNOSIS — I5030 Unspecified diastolic (congestive) heart failure: Secondary | ICD-10-CM | POA: Diagnosis not present

## 2021-03-24 DIAGNOSIS — R208 Other disturbances of skin sensation: Secondary | ICD-10-CM | POA: Diagnosis not present

## 2021-03-24 DIAGNOSIS — G8929 Other chronic pain: Secondary | ICD-10-CM

## 2021-03-24 NOTE — Progress Notes (Signed)
    SUBJECTIVE:   CHIEF COMPLAINT / HPI:   Feet burning- just on her lower feet, saw someone at [facility] the first time it happened, was given naproxen for two weeks and this helped some. She couldn't sleep when her feet were burning. This went away. She also feels sometimes in the winter her feet get cold and wonders if this is related. Denies numbness or discoloration or tingling of her hands/fingers or feet. Felt her foot/ankle swelled a little with the burning too, but no leg/calf swelling or pain.  Knee pain- right knee, present for about a year, no accident or inciting trauma/event. Feels like a deep ache. No clicking or popping. Takes tylenol and ibuprofen and it feels better.  Rheumatic heart disease- last ECHO 11/04/2020 with EF 37-10%,GYIRS II diastolic dysfunction, LA moderately dilated, RA mildly dilated, mild-mod MV stenosis. Exercise stress test non-diagnostic, no ischemic changes. Cardiologist Dr Johnsie Cancel.  Her right eye underneath still has swelling in the morning. Has tried compresses and allergy meds and didn't help. No redness or itching. No eye pain, vision changes, pain with eye movement. No tearing of eye.  She notes she has had all of her COVID vaccines but forgot her card.  PERTINENT  PMH / PSH: diastolic heart failure, rheumatic mitral stenosis  OBJECTIVE:   BP 96/66   Pulse 74   Ht 5\' 3"  (1.6 m)   Wt 149 lb 3.2 oz (67.7 kg)   LMP 03/23/2021   SpO2 99%   BMI 26.43 kg/m   General: A&O, NAD HEENT: No sign of trauma, EOMI, PERRL, mild swelling under R eye > left, no discoloration or palpable fluctuance Cardiac: RRR, Respiratory: CTAB, normal WOB, no w/c/r GI: Soft, NTTP, non-distended  Extremities: NTTP, no peripheral edema.  Right knee: no effusion, erythema or warmth. Negative anterior/posterior drawer test. Negative Lachmans. Negative McMurray. No medial or lateral laxity appreciated. No joint line tenderness or bony spurs appreciated.  Neuro: Normal gait, moves  all four extremities appropriately. Psych: Appropriate mood and affect  Dermatologic Foot Exam: Nails: No onchomycosis. No nail bed thickening. Callouses: No callouses. No fissures. Web spaces: No macerations or open lesions Redness/Erythema: None  Musculoskeletal Exam: No bunion, hammertoes, prominent metatarsals, collapsed arch, or previous amputation. Vascular Assessment: Pedal hair growth absent. 2+ posterior tibial and dorsalis pedis pulses. Normal cap refill < 2 seconds. Neurologic Exam: 10-gram monofilament exam, R 6/6 and L 6/6.   ASSESSMENT/PLAN:   Eye swelling, right - resent referral to optho, right eye is larger swelling underneath but non-fluctuant and no mass appreciated  Right knee pain - exam today without signs of meniscal or ligamentous injury, due to duration of pain will get X-ray and refer to PT  Burning sensation of feet - unclear cause, with mild swelling per patient report, but not persistant - exam and sensation wnl today - checking B12, CBC, A1c, TSH to start  Diastolic heart failure (HCC) - stable, following with cardiology     Lenoria Chime, MD [place]

## 2021-03-24 NOTE — Patient Instructions (Signed)
It was wonderful to see you today.  Please bring ALL of your medications with you to every visit.   Today we talked about:  - Lab work to assess why your feet are burning - Referral to physical therapy and you can go to [facility] to get an X-ray of your knee today - Referral to opthamology for your right under eye swelling   Thank you for choosing [place].   Please call [phone] with any questions about today's appointment.  Please be sure to schedule follow up at the front  desk before you leave today.   Yehuda Savannah, MD  Family Medicine

## 2021-03-25 LAB — CBC
Hematocrit: 40.6 % (ref 34.0–46.6)
Hemoglobin: 14 g/dL (ref 11.1–15.9)
MCH: 29 pg (ref 26.6–33.0)
MCHC: 34.5 g/dL (ref 31.5–35.7)
MCV: 84 fL (ref 79–97)
Platelets: 237 10*3/uL (ref 150–450)
RBC: 4.82 x10E6/uL (ref 3.77–5.28)
RDW: 12.8 % (ref 11.7–15.4)
WBC: 6.4 10*3/uL (ref 3.4–10.8)

## 2021-03-25 LAB — HEMOGLOBIN A1C
Est. average glucose Bld gHb Est-mCnc: 100 mg/dL
Hgb A1c MFr Bld: 5.1 % (ref 4.8–5.6)

## 2021-03-25 LAB — VITAMIN B12: Vitamin B-12: 750 pg/mL (ref 232–1245)

## 2021-03-25 LAB — TSH: TSH: 1.15 u[IU]/mL (ref 0.450–4.500)

## 2021-03-25 NOTE — Assessment & Plan Note (Signed)
-   unclear cause, with mild swelling per patient report, but not persistant - exam and sensation wnl today - checking B12, CBC, A1c, TSH to start

## 2021-03-25 NOTE — Assessment & Plan Note (Signed)
-   stable, following with cardiology

## 2021-03-25 NOTE — Assessment & Plan Note (Signed)
-   resent referral to optho, right eye is larger swelling underneath but non-fluctuant and no mass appreciated

## 2021-03-25 NOTE — Assessment & Plan Note (Signed)
-   exam today without signs of meniscal or ligamentous injury, due to duration of pain will get X-ray and refer to PT

## 2021-04-05 ENCOUNTER — Ambulatory Visit (HOSPITAL_COMMUNITY)
Admission: RE | Admit: 2021-04-05 | Discharge: 2021-04-05 | Disposition: A | Discharge status: Home / Self Care | Payer: No Typology Code available for payment source | Source: Ambulatory Visit | Attending: Family Medicine | Admitting: Family Medicine

## 2021-04-05 ENCOUNTER — Other Ambulatory Visit: Payer: Self-pay

## 2021-04-05 DIAGNOSIS — G8929 Other chronic pain: Secondary | ICD-10-CM | POA: Diagnosis present

## 2021-04-05 DIAGNOSIS — M25561 Pain in right knee: Secondary | ICD-10-CM | POA: Insufficient documentation

## 2021-06-15 ENCOUNTER — Encounter: Payer: Self-pay | Admitting: Family Medicine

## 2021-06-15 ENCOUNTER — Other Ambulatory Visit: Payer: Self-pay | Admitting: Family Medicine

## 2021-06-23 ENCOUNTER — Encounter (HOSPITAL_COMMUNITY): Payer: Self-pay | Admitting: Emergency Medicine

## 2021-06-23 ENCOUNTER — Ambulatory Visit (HOSPITAL_COMMUNITY)
Admission: EM | Admit: 2021-06-23 | Discharge: 2021-06-23 | Disposition: A | Discharge status: Home / Self Care | Payer: No Typology Code available for payment source | Attending: Emergency Medicine | Admitting: Emergency Medicine

## 2021-06-23 ENCOUNTER — Other Ambulatory Visit: Payer: Self-pay

## 2021-06-23 DIAGNOSIS — U071 COVID-19: Secondary | ICD-10-CM | POA: Diagnosis not present

## 2021-06-23 DIAGNOSIS — R058 Other specified cough: Secondary | ICD-10-CM

## 2021-06-23 MED ORDER — PREDNISONE 20 MG PO TABS: 40.0000 mg | ORAL_TABLET | Freq: Every day | ORAL | 0 refills | Status: AC

## 2021-06-23 MED ORDER — IPRATROPIUM BROMIDE 0.06 % NA SOLN: 2.0000 | Freq: Four times a day (QID) | NASAL | 0 refills | Status: DC

## 2021-06-23 NOTE — Discharge Instructions (Addendum)
Try the Atrovent nasal spray.  Restart Flonase.  Saline nasal irrigation with a Milta Deiters Med rinse and distilled water as often as you want.  Try an antihistamine decongestant combination such as Allegra-D, Claritin-D or Zyrtec D.  This may last for several weeks.  Continue Tessalon and codeine/guaifenesin at night.

## 2021-06-23 NOTE — ED Triage Notes (Signed)
Pt reports last friday tested covid+. Still having cough, having headache, and sore throat.

## 2021-06-23 NOTE — ED Provider Notes (Signed)
HPI  SUBJECTIVE:  Megan Atkins is a 45 y.o. female who presents with worsening cough, headache secondary to the cough, nasal congestion, rhinorrhea, sore throat, postnasal drip.  She reports chest soreness that is present only after coughing.  She was diagnosed with COVID 1 week ago, and states that most of her symptoms resolved except for the cough and nasal congestion.  She has not had any fevers for a week.  She was seen at another urgent care and was prescribed guaifenesin/codeine, Tessalon and Flonase.  She is unable to sleep at night secondary to the cough.  She states that the cough syrup helps.  [name] is not working for her.  Symptoms are worse with lying down.  She denies orthopnea, lower extremity edema, nocturia, unintentional weight gain, PND, abdominal pain.  Past medical history of rheumatic heart disease, congestive heart failure.  No history of GERD, pulmonary disease, smoking, hypertension, diabetes.  PMD: [facility].    Past Medical History:  Diagnosis Date   Hemorrhoid 11/2012   Mitral stenosis    last echo 05/2011 at [facility]   Palpitations    Rheumatic heart disease     Past Surgical History:  Procedure Laterality Date   HEMORRHOID SURGERY N/A 11/18/2012   Procedure: HEMORRHOIDECTOMY;  Surgeon: Imogene Burn. Georgette Dover, MD;  Location: [facility];  Service: General;  Laterality: N/A;   NO PAST SURGERIES      Family History  Family history unknown: Yes    Social History   Tobacco Use   Smoking status: Never   Smokeless tobacco: Never  Substance Use Topics   Alcohol use: No   Drug use: No    No current facility-administered medications for this encounter.  Current Outpatient Medications:    ipratropium (ATROVENT) 0.06 % nasal spray, Place 2 sprays into both nostrils 4 (four) times daily., Disp: 15 mL, Rfl: 0   predniSONE (DELTASONE) 20 MG tablet, Take 2 tablets (40 mg total) by mouth daily with breakfast for 5 days., Disp: 10  tablet, Rfl: 0   atenolol (TENORMIN) 50 MG tablet, Take 1 tablet (50 mg total) by mouth daily., Disp: 90 tablet, Rfl: 3   diclofenac Sodium (VOLTAREN) 1 % GEL, Apply 2 g topically 4 (four) times daily., Disp: 150 g, Rfl: 3   Elastic Bandages & Supports (ANKLE KNITTED COMPRESSION) MISC, 1 Units by Does not apply route daily., Disp: 1 each, Rfl: 0   fluticasone (FLONASE) 50 MCG/ACT nasal spray, Place 2 sprays into both nostrils daily., Disp: 16 g, Rfl: 6   levocetirizine (XYZAL) 5 MG tablet, TAKE 1 TABLET BY MOUTH EVERY DAY IN THE EVENING, Disp: 30 tablet, Rfl: 2   timolol (BETIMOL) 0.5 % ophthalmic solution, 1 drop 2 (two) times daily., Disp: , Rfl:   No Known Allergies   ROS  As noted in HPI.   Physical Exam  BP 128/83 (BP Location: Left Arm)   Pulse (!) 30   Temp 98.4 F (36.9 C) (Oral)   Resp 17   LMP 06/10/2021   SpO2 98%   Heart rate with 2 different machines 62 and 65 on my exam   Constitutional: Well developed, well nourished, no acute distress Eyes:  EOMI, conjunctiva normal bilaterally HENT: Normocephalic, atraumatic,mucus membranes moist.  Positive nasal congestion.  Erythematous, swollen turbinates.  No maxillary, frontal sinus tenderness.  Positive cobblestoning and postnasal drip. Respiratory: Normal inspiratory effort, lungs clear bilaterally.  No anterior , lateral chest wall tenderness Cardiovascular: Normal rate, regular rhythm, no  murmurs rubs or gallops GI: nondistended skin: No rash, skin intact Musculoskeletal: no deformities Neurologic: Alert & oriented x 3, no focal neuro deficits Psychiatric: Speech and behavior appropriate   ED Course   Medications - No data to display  No orders of the defined types were placed in this encounter.   No results found for this or any previous visit (from the past 24 hour(s)). No results found.  ED Clinical Impression  1. COVID-19 virus infection   2. Post-viral cough syndrome      ED  Assessment/Plan  Presentation most consistent with postviral cough syndrome.  Doubt pneumonia or sinusitis in the absence of fevers, focal lung findings, hypoxia, sinus tenderness.  She does have extensive postnasal drip.  Will send home with Atrovent nasal spray, prednisone, will have her try an antihistamine/decongestant combination.  She is to restart Flonase as well.  Follow-up with PMD as needed  Discussed MDM, treatment plan, and plan for follow-up with patient.patient agrees with plan.   Meds ordered this encounter  Medications   ipratropium (ATROVENT) 0.06 % nasal spray    Sig: Place 2 sprays into both nostrils 4 (four) times daily.    Dispense:  15 mL    Refill:  0   predniSONE (DELTASONE) 20 MG tablet    Sig: Take 2 tablets (40 mg total) by mouth daily with breakfast for 5 days.    Dispense:  10 tablet    Refill:  0      *This clinic note was created using Lobby[employer]. Therefore, there may be occasional mistakes despite careful proofreading.  ?    Melynda Ripple, MD 06/24/21 1243

## 2021-08-28 ENCOUNTER — Other Ambulatory Visit: Payer: Self-pay | Admitting: Cardiovascular Disease

## 2021-08-28 ENCOUNTER — Telehealth: Payer: Self-pay | Admitting: Cardiovascular Disease

## 2021-08-28 MED ORDER — ATENOLOL 50 MG PO TABS: 50.0000 mg | ORAL_TABLET | Freq: Every day | ORAL | 0 refills | Status: DC

## 2021-08-28 NOTE — Telephone Encounter (Signed)
*  STAT* If patient is at the pharmacy, call can be transferred to refill team.   1. Which medications need to be refilled? (please list name of each medication and dose if known)  Atenolol  2. Which pharmacy/location (including street and city if local pharmacy) is medication to be sent to?[facility] [address] [place]   3. Do they need a 30 day or 90 day supply? 90 days

## 2021-08-28 NOTE — Telephone Encounter (Signed)
Prescription sent to patient's preferred pharmacy

## 2021-09-07 ENCOUNTER — Other Ambulatory Visit: Payer: Self-pay

## 2021-09-07 ENCOUNTER — Ambulatory Visit: Payer: No Typology Code available for payment source | Admitting: Family Medicine

## 2021-09-07 ENCOUNTER — Ambulatory Visit (INDEPENDENT_AMBULATORY_CARE_PROVIDER_SITE_OTHER): Payer: No Typology Code available for payment source | Admitting: Student

## 2021-09-07 ENCOUNTER — Encounter: Payer: Self-pay | Admitting: Family Medicine

## 2021-09-07 VITALS — BP 109/68 | HR 66 | Ht 63.0 in | Wt 147.0 lb

## 2021-09-07 DIAGNOSIS — L918 Other hypertrophic disorders of the skin: Secondary | ICD-10-CM

## 2021-09-07 DIAGNOSIS — R229 Localized swelling, mass and lump, unspecified: Secondary | ICD-10-CM

## 2021-09-07 NOTE — Patient Instructions (Addendum)
It was wonderful to meet you today. Thank you for allowing me to be a part of your care. Below is a short summary of what we discussed at your visit today:  We removed a skin tag on your left back with no complications. The excised tissue is being set to the lab, will follow up with result in about a week.  Provided you with wound care information and return precautions.     If you have any questions or concerns, please do not hesitate to contact us via phone or MyChart message.   Alen Bleacher, MD [facility]

## 2021-09-07 NOTE — Patient Instructions (Signed)
Thank you for coming in today.  We discussed the skin tag on your back.  Which will be removed today by our dermatology team. We also discussed lumps under her skin for that we will get an ultrasound I will contact you when the results are available.  Dr. Janus Molder

## 2021-09-07 NOTE — Progress Notes (Signed)
Procedure Note Diagnosis: normal skin tags   Location: back Informed Consent: Discussed risks (permanent scarring, infection, pain, bleeding, bruising, redness, and recurrence of the lesion) and benefits of the procedure, as well as the alternatives. She is aware that skin tags are benign lesions, and their removal is often not considered medically necessary. Informed consent was obtained. Preparation: The area was prepared in a standard fashion. Anesthesia: Lidocaine 1% with epinephrine Procedure Details:  With patient laying in prone position, identified the skin tag on the patient's back.  The site was sterile prepped with alcohol wipes and betadine. 1cc of 1% lidocaine with epinephrine was injected into the root of the lesion for anesthesia and waited for a minute Skin tag was gently lifted with forceps and excised with scapulae. There was minimal bleed controlled with gauze and pressure. Silver nitrate was applied for hemostasis. Ointment and bandage were applied. The patient tolerated the procedure well. Total number of lesions treated: 1 Plan: The patient was instructed on post-op care and return precautions. Recommend OTC analgesia as needed for pain.  Dr. Erin Hearing precept the procedure

## 2021-09-07 NOTE — Progress Notes (Signed)
    SUBJECTIVE:   CHIEF COMPLAINT / HPI:   [name] is a 45 yo F who presents for the below.   Bumps under skin Has felt areas of hardness or bumps under skin in the anterior and right lateral thoracic region. They are intermittently painful. She noticed this 6 months ago. Denies fever, night sweats, or other symptoms.   Skin tag On her back. It is not painful but she would like it to be removed.   PERTINENT  PMH / PSH: As above.   OBJECTIVE:   BP 109/68   Pulse 66   Ht 5\' 3"  (1.6 m)   Wt 147 lb (66.7 kg)   LMP 08/22/2021 (Exact Date)   SpO2 100%   BMI 26.04 kg/m   General: Appears well, no acute distress. Age appropriate. Respiratory: normal effort Abdomen: soft, nontender, nondistended Extremities: No edema or cyanosis. Skin: Questionable nodules palpated under skin in the anterior and right lateral lower thoracic region. Skin tag present below.  Neuro: alert and oriented Psych: normal affect  Media Information Document Information  Photos  Skin tag on the back.  09/07/2021 14:54  Attached To:  Office Visit on 09/07/21 with Megan Bleacher, MD   Source Information  Megan Bleacher, MD  [facility]    ASSESSMENT/PLAN:   No problem-specific Assessment & Plan notes found for this encounter. 1. Skin lumps Discussed and examined by Dr. Ardelia Atkins which was unable to locate nodules. I was able to palpated nodules under skin in the anterior upper abdominal regional as well as right lateral upper abdomen/lower thoracic region. Will obtain soft tissue imaging. Lower chest/ upper abdomen soft tissue to be evaluated. No B symptoms. Will follow up when imaging available.  - [place] CHEST SOFT TISSUE; Future  2. Skin tag Transferred care to dermatology clinic today for removal. Please appreciate their note.    Megan Atkins, Megan Atkins

## 2021-09-14 ENCOUNTER — Other Ambulatory Visit (HOSPITAL_COMMUNITY): Payer: No Typology Code available for payment source

## 2021-09-26 ENCOUNTER — Other Ambulatory Visit: Payer: Self-pay | Admitting: Cardiovascular Disease

## 2021-09-26 ENCOUNTER — Telehealth: Payer: Self-pay | Admitting: Cardiovascular Disease

## 2021-09-26 NOTE — Telephone Encounter (Signed)
° ° °*  STAT* If patient is at the pharmacy, call can be transferred to refill team.   1. Which medications need to be refilled? (please list name of each medication and dose if known) atenolol (TENORMIN) 50 MG tablet  2. Which pharmacy/location (including street and city if local pharmacy) is medication to be sent to? [place], [place]  3. Do they need a 30 day or 90 day supply? 90 days  Pt made an appt with Dr. Johnsie Cancel on 02/05/22

## 2021-09-27 MED ORDER — ATENOLOL 50 MG PO TABS: 50.0000 mg | ORAL_TABLET | Freq: Every day | ORAL | 1 refills | Status: DC

## 2021-09-27 NOTE — Telephone Encounter (Signed)
Pt's medication was sent to pt's pharmacy as requested. Confirmation received.  °

## 2021-11-01 ENCOUNTER — Other Ambulatory Visit: Payer: Self-pay

## 2021-11-01 ENCOUNTER — Ambulatory Visit (HOSPITAL_COMMUNITY)
Admission: RE | Admit: 2021-11-01 | Discharge: 2021-11-01 | Disposition: A | Discharge status: Home / Self Care | Payer: PRIVATE HEALTH INSURANCE | Source: Ambulatory Visit | Attending: Family Medicine | Admitting: Family Medicine

## 2021-11-01 DIAGNOSIS — R229 Localized swelling, mass and lump, unspecified: Secondary | ICD-10-CM | POA: Insufficient documentation

## 2021-11-06 ENCOUNTER — Telehealth: Payer: Self-pay | Admitting: Family Medicine

## 2021-11-06 NOTE — Telephone Encounter (Signed)
Attempted to call patient with results. No answer. Generic message left. Will communicate via mychart.   Gerlene Fee, DO 11/06/2021, 5:29 PM PGY-3, [place]

## 2022-01-09 NOTE — Progress Notes (Signed)
? ? ?  SUBJECTIVE:  ? ?CHIEF COMPLAINT / HPI:  ? ?Vaginal itching- x2 months, also some suprapubic pain. No pain with intercourse. Thick yellow discharge with odor. No dysuria, hematuria, fevers, chills. One partner her husband. Never had anything like this before. LMP 3/23 and was normal.  ? ?Bumps under skin- present for last six months, hurts when touches but otherwise not bothering her. Doesn't think that they are growing. Also noting some abdominal bloating and weight gain. Sometimes feels tight/uncomfortable. Worried about lymphoma. Does have night sweats occasionally, still having regular menses. No fevers. No weight loss. Sometimes has early satiety, but not always. Previous US of these skin lesions were consistent with lipoma. Sometimes has constipation but uses miralax and this goes away. No blood in stool. No family history of colon cancer. ? ?PERTINENT  PMH / PSH: rheumatic heart disease, diastolic heart failure ? ?OBJECTIVE:  ? ?BP 96/60   Pulse 79   Ht 5' 3" (1.6 m)   Wt 150 lb 3.2 oz (68.1 kg)   LMP 12/28/2021   SpO2 98%   BMI 26.61 kg/m?   ?General: A&O, NAD ?HEENT: No sign of trauma, EOM grossly intact ?Respiratory: normal WOB ?GI: Soft, NTTP, non-distended  ?Extremities: NTTP, no peripheral edema. ?Skin: small rubbery 1 cm /1 cm mobile mass on right thigh, right groin, R buttock. Sligtly larger mobile mass on right and left back subcutaneously 2cm x 2cm. ?Neuro: Normal gait, moves all four extremities appropriately. ?Psych: Appropriate mood and affect ? ?GU: Normal appearance of labia majora and minora, without lesions. Vagina tissue pink, moist, without lesions or abrasions. Small amount of yellow discharge, with fishy odor. Cervix normal appearance, non-friable, without discharge from os. On bimanual exam, no cervical motion tenderness, no adnexal masses appreciated ? ?Sheri Saunders CMA chaperone present for GU exam. ? ?ASSESSMENT/PLAN:  ? ?Abdominal bloating ?- ddx includes constipation vs  more worrisome pathology like malignancy, with having some night sweats and early satiety and these skin bumps which feel like lipomas but cannot rule out lymph nodes ?- will get CBC w/diff, CMP, HIV, ESR to start, and abdominal US ordered to rule out ascites ?- consider GI referral for EGD/colonoscopy pending lab results ? ?Need for hepatitis C screening test ?- ordered today ? ?Vaginal itching ?- thin yellow discharge noted on exam today, no cervical motion tenderness or signs of PID, no masses or lesions appreciated ?- will check wet prep and Gc/Chlamydia, will call patient with results ?  ? ? ?Megan E Pray, MD ?[facility]  ? ?

## 2022-01-10 ENCOUNTER — Ambulatory Visit: Payer: PRIVATE HEALTH INSURANCE | Admitting: Family Medicine

## 2022-01-10 ENCOUNTER — Other Ambulatory Visit (HOSPITAL_COMMUNITY)
Admission: RE | Admit: 2022-01-10 | Discharge: 2022-01-10 | Disposition: A | Discharge status: Home / Self Care | Payer: PRIVATE HEALTH INSURANCE | Source: Ambulatory Visit | Attending: Family Medicine | Admitting: Family Medicine

## 2022-01-10 ENCOUNTER — Encounter: Payer: Self-pay | Admitting: Family Medicine

## 2022-01-10 ENCOUNTER — Other Ambulatory Visit: Payer: Self-pay | Admitting: Family Medicine

## 2022-01-10 VITALS — BP 96/60 | HR 79 | Ht 63.0 in | Wt 150.2 lb

## 2022-01-10 DIAGNOSIS — R140 Abdominal distension (gaseous): Secondary | ICD-10-CM | POA: Insufficient documentation

## 2022-01-10 DIAGNOSIS — B379 Candidiasis, unspecified: Secondary | ICD-10-CM

## 2022-01-10 DIAGNOSIS — N898 Other specified noninflammatory disorders of vagina: Secondary | ICD-10-CM | POA: Diagnosis present

## 2022-01-10 DIAGNOSIS — Z1159 Encounter for screening for other viral diseases: Secondary | ICD-10-CM

## 2022-01-10 LAB — POCT WET PREP (WET MOUNT)
Clue Cells Wet Prep Whiff POC: NEGATIVE
Trichomonas Wet Prep HPF POC: ABSENT

## 2022-01-10 MED ORDER — FLUCONAZOLE 150 MG PO TABS: 150.0000 mg | ORAL_TABLET | Freq: Once | ORAL | 0 refills | Status: AC

## 2022-01-10 NOTE — Assessment & Plan Note (Signed)
-   thin yellow discharge noted on exam today, no cervical motion tenderness or signs of PID, no masses or lesions appreciated ?- will check wet prep and Gc/Chlamydia, will call patient with results ?

## 2022-01-10 NOTE — Assessment & Plan Note (Signed)
-   ordered today ?

## 2022-01-10 NOTE — Patient Instructions (Addendum)
It was wonderful to see you today. ? ?Please bring ALL of your medications with you to every visit.  ? ?Today we talked about: ? ?We did swabs, I will call you with results ? ?I feel the small bumps you are noticing on your legs and abdomen. Lets start with lab work and an abdominal US to see if any fluid/lymph nodes noted. Based on your labs, we may order additional imaging. ? ? ?Thank you for choosing [facility].  ? ?Please call [phone] with any questions about today's appointment. ? ?Please be sure to schedule follow up at the front  desk before you leave today.  ? ?Please arrive at least 15 minutes prior to your scheduled appointments. ?  ?If you had blood work today, I will send you a MyChart message or a letter if results are normal. Otherwise, I will give you a call. ?  ?If you had a referral placed, they will call you to set up an appointment. Please give [place] a call if you don't hear back in the next 2 weeks. ?  ?If you need additional refills before your next appointment, please call your pharmacy first.  ? ?Yehuda Savannah, MD  ?Family Medicine   ?

## 2022-01-10 NOTE — Assessment & Plan Note (Signed)
-  ddx includes constipation vs more worrisome pathology like malignancy, with having some night sweats and early satiety and these skin bumps which feel like lipomas but cannot rule out lymph nodes ?- will get CBC w/diff, CMP, HIV, ESR to start, and abdominal US ordered to rule out ascites ?- consider GI referral for EGD/colonoscopy pending lab results ?

## 2022-01-11 LAB — HIV ANTIBODY (ROUTINE TESTING W REFLEX): HIV Screen 4th Generation wRfx: NONREACTIVE

## 2022-01-11 LAB — CBC WITH DIFFERENTIAL/PLATELET
Basophils Absolute: 0.1 10*3/uL (ref 0.0–0.2)
Basos: 1 %
EOS (ABSOLUTE): 0.3 10*3/uL (ref 0.0–0.4)
Eos: 4 %
Hematocrit: 41.6 % (ref 34.0–46.6)
Hemoglobin: 14.1 g/dL (ref 11.1–15.9)
Immature Grans (Abs): 0 10*3/uL (ref 0.0–0.1)
Immature Granulocytes: 0 %
Lymphocytes Absolute: 1.8 10*3/uL (ref 0.7–3.1)
Lymphs: 27 %
MCH: 28.2 pg (ref 26.6–33.0)
MCHC: 33.9 g/dL (ref 31.5–35.7)
MCV: 83 fL (ref 79–97)
Monocytes Absolute: 0.5 10*3/uL (ref 0.1–0.9)
Monocytes: 7 %
Neutrophils Absolute: 4 10*3/uL (ref 1.4–7.0)
Neutrophils: 61 %
Platelets: 218 10*3/uL (ref 150–450)
RBC: 5 x10E6/uL (ref 3.77–5.28)
RDW: 12.5 % (ref 11.7–15.4)
WBC: 6.6 10*3/uL (ref 3.4–10.8)

## 2022-01-11 LAB — COMPREHENSIVE METABOLIC PANEL
ALT: 16 IU/L (ref 0–32)
AST: 19 IU/L (ref 0–40)
Albumin/Globulin Ratio: 1.7 (ref 1.2–2.2)
Albumin: 4.6 g/dL (ref 3.8–4.8)
Alkaline Phosphatase: 56 IU/L (ref 44–121)
BUN/Creatinine Ratio: 10 (ref 9–23)
BUN: 7 mg/dL (ref 6–24)
Bilirubin Total: 1 mg/dL (ref 0.0–1.2)
CO2: 20 mmol/L (ref 20–29)
Calcium: 9.6 mg/dL (ref 8.7–10.2)
Chloride: 106 mmol/L (ref 96–106)
Creatinine, Ser: 0.68 mg/dL (ref 0.57–1.00)
Globulin, Total: 2.7 g/dL (ref 1.5–4.5)
Glucose: 93 mg/dL (ref 70–99)
Potassium: 4.4 mmol/L (ref 3.5–5.2)
Sodium: 141 mmol/L (ref 134–144)
Total Protein: 7.3 g/dL (ref 6.0–8.5)
eGFR: 109 mL/min/{1.73_m2} (ref >59)

## 2022-01-11 LAB — CERVICOVAGINAL ANCILLARY ONLY
Chlamydia: NEGATIVE
Comment: NEGATIVE
Comment: NEGATIVE
Comment: NORMAL
Neisseria Gonorrhea: NEGATIVE
Trichomonas: NEGATIVE

## 2022-01-11 LAB — HCV AB W REFLEX TO QUANT PCR: HCV Ab: NONREACTIVE

## 2022-01-11 LAB — HCV INTERPRETATION

## 2022-01-11 LAB — SEDIMENTATION RATE: Sed Rate: 7 mm/hr (ref 0–32)

## 2022-01-12 ENCOUNTER — Telehealth: Payer: Self-pay | Admitting: Cardiovascular Disease

## 2022-01-12 ENCOUNTER — Encounter: Payer: Self-pay | Admitting: Family Medicine

## 2022-01-12 MED ORDER — ATENOLOL 50 MG PO TABS: 50.0000 mg | ORAL_TABLET | Freq: Every day | ORAL | 0 refills | Status: DC

## 2022-01-12 NOTE — Telephone Encounter (Signed)
Pt's medication was sent to pt's pharmacy as requested. Confirmation received.  °

## 2022-01-12 NOTE — Telephone Encounter (Signed)
? ?*  STAT* If patient is at the pharmacy, call can be transferred to refill team. ? ? ?1. Which medications need to be refilled? (please list name of each medication and dose if known) atenolol (TENORMIN) 50 MG tablet ? ?2. Which pharmacy/location (including street and city if local pharmacy) [facility] [address] [place], [place] [address] ?Phone: [phone] ? ?3. Do they need a 30 day or 90 day supply? 30 days  ? ?Pt is out of meds, needs refill today ?

## 2022-01-15 ENCOUNTER — Other Ambulatory Visit: Payer: Self-pay | Admitting: Family Medicine

## 2022-01-15 MED ORDER — FLUCONAZOLE 150 MG PO TABS: 150.0000 mg | ORAL_TABLET | Freq: Once | ORAL | 0 refills | Status: AC

## 2022-01-25 NOTE — Progress Notes (Signed)
? ?Date:  01/25/2022  ? ?ID:  Megan Atkins, DOB 08-Aug-1976, MRN [id number] ? ? ?PCP:  Lenoria Chime, MD  ?Cardiologist:   Johnsie Cancel ?Electrophysiologist:  None  ? ?Evaluation Performed:  Follow-Up Visit ? ?Chief Complaint:  Rheumatic mitral valve disease ? ?History of Present Illness:   ? ?46 y.o. with history of moderate MS and rheumatic mitral valve disease Echo March 2018 reviewed EF 60-65% moderate MS mild MR Valve area by PT1/2 1.35 cm2  Moderate LAE estimated PA 39 mmHg mean gradient 9 mmHg peak 10 mmHg She has a history of palpitations but no documented PAF. Event monitor ordered in 2018 not completed by patient June 2020 husband tested positive for COVID but patient tested negative  ? ?She is asymptomatic and compliant with her meds  ? ?Echo 04/01/19 severe LAE normal EF 60-65% gradients similar mean 8 MVA 1.2 cm2 with mild MR and estimated PA pressure 46 mmHg  ? ?Atenolol dose increased to 50 mg daily July 2020  ? ?Long term monitor 05/12/19 with no PAF had PAC/PVC;s <1% beats ?And one run of AVNRT rates 160 was supposed to see EP but never did ? ?She gets occasional sporadic palpitations  ? ?ETT 11/04/20 only 67% PMHR no arrhythmia or ST changes ? ?TTE 11/04/20 EF strain normal moderate LAE mild RAE ?MVA 1.3 cm2 mild MR Mild/Mod MS mean gradient 8.5 mmHg  ?Moderate TR  ? ?Some palpitations nothing sustained Discussed getting monitor if she does and showed her how to take pulse. 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 ? ? ?Past Medical History:  ?Diagnosis Date  ? Hemorrhoid 11/2012  ? Mitral stenosis   ? last echo 05/2011 at [facility]  ? Palpitations   ? Rheumatic heart disease   ? ?Past Surgical History:  ?Procedure Laterality Date  ? HEMORRHOID SURGERY N/A 11/18/2012  ? Procedure: HEMORRHOIDECTOMY;  Surgeon: Imogene Burn. Georgette Dover, MD;  Location: [facility];  Service: General;  Laterality: N/A;  ? NO PAST SURGERIES    ?  ? ?No outpatient medications have  been marked as taking for the 02/05/22 encounter (Appointment) with [name], MD.  ?  ? ?Allergies:   Patient has no known allergies.  ? ?Social History  ? ?Tobacco Use  ? Smoking status: Never  ? Smokeless tobacco: Never  ?Substance Use Topics  ? Alcohol use: No  ? Drug use: No  ?  ? ?Family Hx: ?The patient's Family history is unknown by patient. ? ?ROS:   ?Please see the history of present illness.    ? ?All other systems reviewed and are negative. ? ? ?Prior CV studies:   ?The following studies were reviewed today: ? ?Echo 01/03/17 see HPI ?Echo 04/01/19  ?Monitor 05/12/19  ? ?Labs/Other Tests and Data Reviewed:   ? ?EKG:  NSR rate 67 normal 07/28/20 SR ICRBBB rate 70 LAE otherwise normal 02/05/2022 NSR rate 59 normal  ? ?Recent Labs: ?03/24/2021: TSH 1.150 ?01/10/2022: ALT 16; BUN 7; Creatinine, Ser 0.68; Hemoglobin 14.1; Platelets 218; Potassium 4.4; Sodium 141  ? ?Recent Lipid Panel ?Lab Results  ?Component Value Date/Time  ? CHOL 138 07/16/2018 03:21 PM  ? TRIG 101 07/16/2018 03:21 PM  ? HDL 40 07/16/2018 03:21 PM  ? CHOLHDL 3.5 07/16/2018 03:21 PM  ? [place] 78 07/16/2018 03:21 PM  ? ? ?Wt Readings from Last 3 Encounters:  ?01/10/22 150 lb 3.2 oz (68.1 kg)  ?09/07/21 147 lb (  66.7 kg)  ?09/07/21 147 lb (66.7 kg)  ?  ? ?Objective:   ? ?Vital Signs:  There were no vitals taken for this visit.  ? ?Affect appropriate ?Healthy:  appears stated age ?HEENT: normal ?Neck supple with no adenopathy ?JVP normal no bruits no thyromegaly ?Lungs clear with no wheezing and good diaphragmatic motion ?Heart:  S1/S2 no murmur, no rub, gallop or click ?PMI normal ?Abdomen: benighn, BS positve, no tenderness, no AAA ?no bruit.  No HSM or HJR ?Distal pulses intact with no bruits ?No edema ?Neuro non-focal ?Skin warm and dry ?No muscular weakness ? ? ?ASSESSMENT & PLAN:   ? ?1. Mitral Stenosis:  She has been asymptomatic without PAF or severe elevation in estimated PA pressures by echo has been on beta blockers Update TTE mean  gradient 8.5 mmHg MVA 1.33 cm2 on TTE 11/04/20 ? ?2. Palpitations :  NSR on ECG today She will call us if they become frequent enough to monitor Discussed risk of PAF again She is taking her Atenolol 50 mg daily  ? ?COVID-19 Education: ?The signs and symptoms of COVID-19 were discussed with the patient and how to seek care for testing (follow up with PCP or arrange E-visit).  The importance of social distancing was discussed today. ? ?Time:   ?Today, I have spent 30 minutes with the patient with telehealth technology discussing the above problems.   ? ? ?Medication Adjustments/Labs and Tests Ordered: ?Current medicines are reviewed at length with the patient today.  Concerns regarding medicines are outlined above.  ? ?Tests Ordered: ? ?TTE  ? ?Medication Changes: ? ?None  ? ?Disposition:  Follow up  In a year  ? ?Signed, ?Jenkins Rouge, MD  ?01/25/2022 12:05 PM    ?[place] ?

## 2022-02-03 ENCOUNTER — Other Ambulatory Visit: Payer: Self-pay | Admitting: Cardiovascular Disease

## 2022-02-05 ENCOUNTER — Encounter: Payer: Self-pay | Admitting: Cardiovascular Disease

## 2022-02-05 ENCOUNTER — Ambulatory Visit (INDEPENDENT_AMBULATORY_CARE_PROVIDER_SITE_OTHER): Payer: PRIVATE HEALTH INSURANCE | Admitting: Cardiovascular Disease

## 2022-02-05 VITALS — BP 100/68 | HR 59 | Ht 63.0 in | Wt 150.0 lb

## 2022-02-05 DIAGNOSIS — R002 Palpitations: Secondary | ICD-10-CM

## 2022-02-05 DIAGNOSIS — I050 Rheumatic mitral stenosis: Secondary | ICD-10-CM | POA: Diagnosis not present

## 2022-02-05 MED ORDER — ATENOLOL 50 MG PO TABS: 50.0000 mg | ORAL_TABLET | Freq: Every day | ORAL | 3 refills | Status: DC

## 2022-02-05 NOTE — Patient Instructions (Signed)
Medication Instructions:  ?Your physician recommends that you continue on your current medications as directed. Please refer to the Current Medication list given to you today. ? ?*If you need a refill on your cardiac medications before your next appointment, please call your pharmacy* ? ?Lab Work: ?If you have labs (blood work) drawn today and your tests are completely normal, you will receive your results only by: ?MyChart Message (if you have MyChart) OR ?A paper copy in the mail ?If you have any lab test that is abnormal or we need to change your treatment, we will call you to review the results. ? ?Testing/Procedures: ?Your physician has requested that you have an echocardiogram. Echocardiography is a painless test that uses sound waves to create images of your heart. It provides your doctor with information about the size and shape of your heart and how well your heart?s chambers and valves are working. This procedure takes approximately one hour. There are no restrictions for this procedure. ? ?Follow-Up: ?At [facility], you and your health needs are our priority.  As part of our continuing mission to provide you with exceptional heart care, we have created designated Provider Care Teams.  These Care Teams include your primary Cardiologist (physician) and Advanced Practice Providers (APPs -  Physician Assistants and Nurse Practitioners) who all work together to provide you with the care you need, when you need it. ? ?We recommend signing up for the patient portal called "MyChart".  Sign up information is provided on this After Visit Summary.  MyChart is used to connect with patients for Virtual Visits (Telemedicine).  Patients are able to view lab/test results, encounter notes, upcoming appointments, etc.  Non-urgent messages can be sent to your provider as well.   ?To learn more about what you can do with MyChart, go to [email or web address].   ? ?Your next appointment:   ?1 year(s) ? ?The format for  your next appointment:   ?In Person ? ?Provider:   ?Jenkins Rouge, MD { ? ?Important Information About [place] ? ? ? ? ?  ?

## 2022-02-05 NOTE — Addendum Note (Signed)
Addended by: Aris Georgia, Joey Hudock L on: 02/05/2022 03:51 PM ? ? Modules accepted: Orders ? ?

## 2022-02-12 ENCOUNTER — Ambulatory Visit (HOSPITAL_COMMUNITY): Payer: PRIVATE HEALTH INSURANCE

## 2022-02-13 ENCOUNTER — Telehealth: Payer: Self-pay

## 2022-02-13 NOTE — Telephone Encounter (Signed)
Patient calls nurse line for ultrasound results.  ? ?Patient reports she had to get scan done at [facility] due to her insurance.  ? ?Ultrasound results are visible in care everywhere. ? ?Will forward to PCP.  ?

## 2022-02-14 ENCOUNTER — Telehealth: Payer: Self-pay | Admitting: Family Medicine

## 2022-02-14 DIAGNOSIS — R140 Abdominal distension (gaseous): Secondary | ICD-10-CM

## 2022-02-14 NOTE — Telephone Encounter (Signed)
Called and confirmed I was speaking with Ms Garver. Discussed Abd [place] results, showing lipoma vs fat necrosis, gallbladder sludge, and otherwise no suspicious lesions or ascites in the abdomen. Since our last visit, she denies any night sweats, no longer feels like she has the early satiety as often. Has been taking miralax every other day and still feels like constipation sometimes, but it is helping. Still notes abdominal bloating. Reviewed normal lab work. Discussed increasing miralax and watchful waiting, with shared decision making she would like GI referral for possible EGD/colonoscopy for previous symptoms. Referral placed. ? ?All questions answered. ?Yehuda Savannah MD ?

## 2022-02-27 ENCOUNTER — Telehealth: Payer: Self-pay | Admitting: Cardiovascular Disease

## 2022-02-27 DIAGNOSIS — R002 Palpitations: Secondary | ICD-10-CM

## 2022-02-27 NOTE — Telephone Encounter (Signed)
Patient scheduled to have monitor applied in office Thursday, 03/01/2022, as 1:30 PM.

## 2022-02-27 NOTE — Telephone Encounter (Signed)
Called patient to let her know Dr. Johnsie Cancel would like her to wear a 14 day live monitor. Placed order. Patient would like to come into office to have monitor put on. Will send message to monitor techs.

## 2022-02-27 NOTE — Telephone Encounter (Signed)
Per Dr. Johnsie Cancel office note - "Palpitations :  NSR on ECG today She will call us if they become frequent enough to monitor Discussed risk of PAF again She is taking her Atenolol 50 mg daily "  Will see which monitor Dr. Johnsie Cancel would like to order and how many days.

## 2022-02-27 NOTE — Telephone Encounter (Signed)
Patient calling to get a heart monitor put on. Please advise

## 2022-02-28 ENCOUNTER — Other Ambulatory Visit: Payer: Self-pay | Admitting: *Deleted

## 2022-02-28 ENCOUNTER — Telehealth: Payer: Self-pay | Admitting: Cardiovascular Disease

## 2022-02-28 MED ORDER — METOPROLOL TARTRATE 50 MG PO TABS: 50.0000 mg | ORAL_TABLET | Freq: Two times a day (BID) | ORAL | 1 refills | Status: DC

## 2022-02-28 MED ORDER — METOPROLOL TARTRATE 50 MG PO TABS: 50.0000 mg | ORAL_TABLET | Freq: Two times a day (BID) | ORAL | 3 refills | Status: DC

## 2022-02-28 NOTE — Telephone Encounter (Signed)
Pt c/o medication issue:  1. Name of Medication: atenolol (TENORMIN) 50 MG tablet  2. How are you currently taking this medication (dosage and times per day)? Take 1 tablet (50 mg total) by mouth daily  3. Are you having a reaction (difficulty breathing--STAT)? no  4. What is your medication issue? If she can take a higher dosage

## 2022-02-28 NOTE — Telephone Encounter (Signed)
Pt aware of recommendations and agrees with plan ./cy 

## 2022-02-28 NOTE — Telephone Encounter (Signed)
Spoke with Megan Atkins  and is calling again with complaints of palpitations Megan Atkins called yesterday and is getting monitor put on tomorrow Megan Atkins notes palpitations every day since Sunday and  is unable to sleep well Megan Atkins notes some fatigue and chest heaviness  no other complaints Per Megan Atkins has noted palpitations for a  month but only occurring every 2-3 days now are every day B/P 100-105 /60-65 HR 68-72 Will forward to Dr Johnsie Cancel for review .Adonis Housekeeper

## 2022-03-01 ENCOUNTER — Ambulatory Visit (INDEPENDENT_AMBULATORY_CARE_PROVIDER_SITE_OTHER): Payer: PRIVATE HEALTH INSURANCE

## 2022-03-01 VITALS — HR 67 | Ht 63.0 in | Wt 150.0 lb

## 2022-03-01 DIAGNOSIS — R002 Palpitations: Secondary | ICD-10-CM | POA: Diagnosis not present

## 2022-03-01 DIAGNOSIS — R079 Chest pain, unspecified: Secondary | ICD-10-CM | POA: Diagnosis not present

## 2022-03-01 DIAGNOSIS — I480 Paroxysmal atrial fibrillation: Secondary | ICD-10-CM

## 2022-03-01 DIAGNOSIS — I050 Rheumatic mitral stenosis: Secondary | ICD-10-CM | POA: Diagnosis not present

## 2022-03-01 NOTE — Progress Notes (Signed)
   Nurse Visit   Date of Encounter: 03/01/2022 ID: Megan Atkins, DOB Mar 05, 1976, MRN [id number]  PCP:  Lenoria Chime, MD   [facility] Providers Cardiologist:  Jenkins Rouge, MD      Visit Details   VS:  Pulse 67   Ht '5\' 3"'$  (1.6 m)   Wt 150 lb (68 kg)   BMI 26.57 kg/m  , BMI Body mass index is 26.57 kg/m.  Wt Readings from Last 3 Encounters:  03/01/22 150 lb (68 kg)  02/05/22 150 lb (68 kg)  01/10/22 150 lb 3.2 oz (68.1 kg)     Reason for visit: EKG Performed today: EKG, Provider consulted:Dr. Curt Bears, and Education Changes (medications, testing, etc.) : Patient just started Metoprolol 50 mg BID last night. Patient stated that the metoprolol has helped her palpitations, but she is still getting them, just not as bad. Patient stated that her symptoms have been getting worse each month since March. Patient stated she is also having trouble laying down to sleep at night. Patient also complained of having chest pressure when she has these palpitations and has a cough during these episodes. Per Dr. Curt Bears EKG shows NSR. Patient had live Zio monitor placed on her today. Will forward note to Dr. Johnsie Cancel. Length of Visit: 30 minutes    Medications Adjustments/Labs and Tests Ordered: No orders of the defined types were placed in this encounter.  No orders of the defined types were placed in this encounter.    Signed, Michaelyn Barter, RN  03/01/2022 3:16 PM

## 2022-03-01 NOTE — Patient Instructions (Signed)
No changes

## 2022-03-01 NOTE — Progress Notes (Unsigned)
[id number] ZIO AT from office inventory applied to patient.

## 2022-03-07 NOTE — Addendum Note (Signed)
Addended by: Aris Georgia, Ariyanah Aguado L on: 03/07/2022 11:14 AM   Modules accepted: Orders

## 2022-03-13 ENCOUNTER — Encounter: Payer: Self-pay | Admitting: *Deleted

## 2022-03-22 ENCOUNTER — Telehealth: Payer: Self-pay

## 2022-03-22 ENCOUNTER — Other Ambulatory Visit: Payer: Self-pay | Admitting: Cardiovascular Disease

## 2022-03-22 DIAGNOSIS — I471 Supraventricular tachycardia: Secondary | ICD-10-CM

## 2022-03-22 NOTE — Telephone Encounter (Signed)
-----   Message from [name], MD sent at 03/21/2022  4:08 PM EDT ----- Episodes of SVT noted refer to EP for evaluation

## 2022-03-22 NOTE — Telephone Encounter (Signed)
Left message for patient to call back  

## 2022-03-27 ENCOUNTER — Other Ambulatory Visit (HOSPITAL_COMMUNITY): Payer: PRIVATE HEALTH INSURANCE

## 2022-04-04 ENCOUNTER — Ambulatory Visit (HOSPITAL_COMMUNITY): Payer: PRIVATE HEALTH INSURANCE | Attending: Cardiology

## 2022-04-04 DIAGNOSIS — I050 Rheumatic mitral stenosis: Secondary | ICD-10-CM | POA: Insufficient documentation

## 2022-04-04 LAB — ECHOCARDIOGRAM COMPLETE
Area-P 1/2: 1.27 cm2
MV VTI: 0.96 cm2
P 1/2 time: 178 msec
S' Lateral: 3 cm

## 2022-04-04 NOTE — Telephone Encounter (Signed)
Patient aware of results. Referral placed.

## 2022-05-08 ENCOUNTER — Ambulatory Visit (INDEPENDENT_AMBULATORY_CARE_PROVIDER_SITE_OTHER): Payer: PRIVATE HEALTH INSURANCE | Admitting: Internal Medicine

## 2022-05-08 ENCOUNTER — Encounter: Payer: Self-pay | Admitting: Internal Medicine

## 2022-05-08 VITALS — BP 110/70 | HR 66 | Ht 63.0 in | Wt 155.0 lb

## 2022-05-08 DIAGNOSIS — R002 Palpitations: Secondary | ICD-10-CM | POA: Diagnosis not present

## 2022-05-08 NOTE — Patient Instructions (Addendum)
Medication Instructions:  Your physician recommends that you continue on your current medications as directed. Please refer to the Current Medication list given to you today.  *If you need a refill on your cardiac medications before your next appointment, please call your pharmacy*  Lab Work: None ordered.  If you have labs (blood work) drawn today and your tests are completely normal, you will receive your results only by: [place] (if you have MyChart) OR A paper copy in the mail If you have any lab test that is abnormal or we need to change your treatment, we will call you to review the results.  Testing/Procedures: None ordered.  Follow-Up: At [facility], you and your health needs are our priority.  As part of our continuing mission to provide you with exceptional heart care, we have created designated Provider Care Teams.  These Care Teams include your primary Cardiologist (physician) and Advanced Practice Providers (APPs -  Physician Assistants and Nurse Practitioners) who all work together to provide you with the care you need, when you need it.  We recommend signing up for the patient portal called "MyChart".  Sign up information is provided on this After Visit Summary.  MyChart is used to connect with patients for Virtual Visits (Telemedicine).  Patients are able to view lab/test results, encounter notes, upcoming appointments, etc.  Non-urgent messages can be sent to your provider as well.   To learn more about what you can do with MyChart, go to [email or web address].    Your next appointment:   6 Months  The format for your next appointment:   In Person  Provider:   Cristopher Peru, MD{or one of the following Advanced Practice Providers on your designated Care Team:   Tommye Standard, [place] Legrand Como "Jonni Sanger" Chalmers Cater, [place]   Important Information About Sugar

## 2022-05-08 NOTE — Progress Notes (Signed)
HPI Megan Atkins is referred by Dr. Johnsie Cancel for evaluation of palpitations and NS SVT. She is an anxious but otherwise pleasant 46 yo woman from [place] who has had worsening episodes of palpitations over the past few months. The patient has worn a cardiac monitor which demonstrates NSR with brief episodes of NS SVT, along with NSR. The NS SVT was 10 seconds or less. The patient did not have any sustained pauses. She has palpitations and non-cardiac chest pa in. No syncope.   No Known Allergies   Current Outpatient Medications  Medication Sig Dispense Refill   diclofenac Sodium (VOLTAREN) 1 % GEL Apply topically 4 (four) times daily.     metoprolol tartrate (LOPRESSOR) 50 MG tablet TAKE 1 TABLET BY MOUTH TWICE A DAY 60 tablet 11   timolol (BETIMOL) 0.5 % ophthalmic solution 1 drop 2 (two) times daily.     No current facility-administered medications for this visit.     Past Medical History:  Diagnosis Date   Hemorrhoid 11/2012   Mitral stenosis    last echo 05/2011 at [facility]   Palpitations    Rheumatic heart disease     ROS:   All systems reviewed and negative except as noted in the HPI.   Past Surgical History:  Procedure Laterality Date   HEMORRHOID SURGERY N/A 11/18/2012   Procedure: HEMORRHOIDECTOMY;  Surgeon: Imogene Burn. Georgette Dover, MD;  Location: [place];  Service: General;  Laterality: N/A;   NO PAST SURGERIES       Family History  Family history unknown: Yes     Social History   Socioeconomic History   Marital status: Married    Spouse name: Not on file   Number of children: Not on file   Years of education: Not on file   Highest education level: Not on file  Occupational History   Not on file  Tobacco Use   Smoking status: Never   Smokeless tobacco: Never  Substance and Sexual Activity   Alcohol use: No   Drug use: No   Sexual activity: Yes  Other Topics Concern   Not on file  Social History Narrative   Moved here from  [place] in 2010.   Diagnosed with Rheumatic heart disease prior to arrival    Social Determinants of Health   Financial Resource Strain: Not on file  Food Insecurity: Not on file  Transportation Needs: Not on file  Physical Activity: Not on file  Stress: Not on file  Social Connections: Not on file  Intimate Partner Violence: Not on file     BP 110/70   Pulse 66   Ht '5\' 3"'$  (1.6 m)   Wt 155 lb (70.3 kg)   SpO2 99%   BMI 27.46 kg/m   Physical Exam:  Well appearing NAD HEENT: Unremarkable Neck:  No JVD, no thyromegally Lymphatics:  No adenopathy Back:  No CVA tenderness Lungs:  Clear with no wheezes HEART:  Regular rate rhythm, no murmurs, no rubs, no clicks Abd:  soft, positive bowel sounds, no organomegally, no rebound, no guarding Ext:  2 plus pulses, no edema, no cyanosis, no clubbing Skin:  No rashes no nodules Neuro:  CN II through XII intact, motor grossly intact  EKG - reviewed.  nsr   Assess/Plan:  NS SVT - I have discussed the beign nature of her symptoms and recommended continued use of low dose beta blocker as needed for break through symptoms. In addition, I asked her to  avoid caffeine and ETOH. No specific followup recommended. If her symptoms worsened, flecainide could be a consideration.   Megan Overlie [name],MD

## 2022-05-22 NOTE — Progress Notes (Unsigned)
Date:  05/23/2022   ID:  Megan Atkins, DOB [birth year], MRN [id number]   PCP:  Lenoria Chime, MD  Cardiologist:   Johnsie Cancel Electrophysiologist:  None   Evaluation Performed:  Follow-Up Visit  Chief Complaint:  Rheumatic mitral valve disease  History of Present Illness:    46 y.o. with history of moderate MS and rheumatic mitral valve disease Echo March 2018 reviewed EF 60-65% moderate MS mild MR Valve area by PT1/2 1.35 cm2  Moderate LAE estimated PA 39 mmHg mean gradient 9 mmHg peak 10 mmHg History of palpitations with no PAF documented by monitor 2018, 2020 and more recently 03/01/22 Seen by [facility] for self limited SVT no Rx other than beta blocker suggested   ETT 11/04/20 only 67% PMHR no arrhythmia or ST changes  TTE 11/04/20 EF strain normal moderate LAE mild RAE MVA 1.3 cm2 mild MR Mild/Mod MS mean gradient 8.5 mmHg  Moderate TR   TTE 04/04/22 severe LAE mild/mod MR moderate/severe MS mean gradient 10 mmHg MVA 1.27 cm2 but < 1 cm2 by VTI and peak gradient 18 mmHg at HR of 74 bpm mild elevation in PA systolic pressure   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 She is working full time Only gets mild dyspnea going up stairs. Feels improved with lopressor   Discussed timing of MVR.  She is still young and would be nice to wait as long as possible Given lack of CHF, PAF and symptoms favor continued beta blocker Rx and serial TTE;s     Past Medical History:  Diagnosis Date   Hemorrhoid 11/2012   Mitral stenosis    last echo 05/2011 at [facility]   Palpitations    Rheumatic heart disease    Past Surgical History:  Procedure Laterality Date   HEMORRHOID SURGERY N/A 11/18/2012   Procedure: HEMORRHOIDECTOMY;  Surgeon: Imogene Burn. Georgette Dover, MD;  Location: [place];  Service: General;  Laterality: N/A;   NO PAST SURGERIES       Current Meds  Medication Sig   Clotrimazole 1 % OINT Apply topically as needed.   diclofenac  Sodium (VOLTAREN) 1 % GEL Apply topically 4 (four) times daily.   metoprolol tartrate (LOPRESSOR) 50 MG tablet TAKE 1 TABLET BY MOUTH TWICE A DAY   timolol (BETIMOL) 0.5 % ophthalmic solution 1 drop 2 (two) times daily.     Allergies:   Patient has no known allergies.   Social History   Tobacco Use   Smoking status: Never   Smokeless tobacco: Never  Substance Use Topics   Alcohol use: No   Drug use: No     Family Hx: The patient's Family history is unknown by patient.  ROS:   Please see the history of present illness.     All other systems reviewed and are negative.   Prior CV studies:   The following studies were reviewed today:  TTE 04/04/22  Monitor 03/21/22   Labs/Other Tests and Data Reviewed:    EKG:  NSR rate 67 normal 07/28/20 SR ICRBBB rate 70 LAE otherwise normal 05/23/2022 NSR rate 59 normal   Recent Labs: 01/10/2022: ALT 16; BUN 7; Creatinine, Ser 0.68; Hemoglobin 14.1; Platelets 218; Potassium 4.4; Sodium 141   Recent Lipid Panel Lab Results  Component Value Date/Time   CHOL 138 07/16/2018 03:21 PM   TRIG 101 07/16/2018 03:21 PM   HDL 40 07/16/2018 03:21 PM   CHOLHDL 3.5 07/16/2018  03:21 PM   LDLCALC 78 07/16/2018 03:21 PM    Wt Readings from Last 3 Encounters:  05/08/22 155 lb (70.3 kg)  03/01/22 150 lb (68 kg)  02/05/22 150 lb (68 kg)     Objective:    Vital Signs:  There were no vitals taken for this visit.   Affect appropriate Healthy:  appears stated age 11: normal Neck supple with no adenopathy JVP normal no bruits no thyromegaly Lungs clear with no wheezing and good diaphragmatic motion Heart:  S1/S2 no murmur, no rub, gallop or click PMI normal Abdomen: benighn, BS positve, no tenderness, no AAA no bruit.  No HSM or HJR Distal pulses intact with no bruits No edema Neuro non-focal Skin warm and dry No muscular weakness   ASSESSMENT & PLAN:    1. Mitral Stenosis:  She has been asymptomatic without PAF or severe elevation  in estimated PA pressures by echo has been on beta blockers Progression by TTE done 04/04/22 but asymptomatic with no clinical CHF/PAF  Continue lopressor f/u in 6 months   2. Palpitations :  No documented PAF on multiple monitors most recently 03/01/22 seen by EP Dr Lovena Le self limited SVT continue lopressor   COVID-19 Education: The signs and symptoms of COVID-19 were discussed with the patient and how to seek care for testing (follow up with PCP or arrange E-visit).  The importance of social distancing was discussed today.  Time:   Today, I have spent 30 minutes with the patient with telehealth technology discussing the above problems.     Medication Adjustments/Labs and Tests Ordered: Current medicines are reviewed at length with the patient today.  Concerns regarding medicines are outlined above.   Tests Ordered:  None  Medication Changes:  None   Disposition:  Follow up 6 months   Signed, Jenkins Rouge, MD  05/23/2022 1:59 PM    [facility]

## 2022-05-23 ENCOUNTER — Ambulatory Visit (INDEPENDENT_AMBULATORY_CARE_PROVIDER_SITE_OTHER): Payer: PRIVATE HEALTH INSURANCE | Admitting: Cardiovascular Disease

## 2022-05-23 ENCOUNTER — Encounter: Payer: Self-pay | Admitting: Cardiovascular Disease

## 2022-05-23 ENCOUNTER — Encounter: Payer: PRIVATE HEALTH INSURANCE | Admitting: Family Medicine

## 2022-05-23 VITALS — BP 90/80 | HR 72 | Ht 63.0 in | Wt 149.8 lb

## 2022-05-23 DIAGNOSIS — R002 Palpitations: Secondary | ICD-10-CM | POA: Diagnosis not present

## 2022-05-23 DIAGNOSIS — I471 Supraventricular tachycardia: Secondary | ICD-10-CM

## 2022-05-23 DIAGNOSIS — I050 Rheumatic mitral stenosis: Secondary | ICD-10-CM | POA: Diagnosis not present

## 2022-05-23 NOTE — Patient Instructions (Signed)

## 2022-05-23 NOTE — Progress Notes (Deleted)
    SUBJECTIVE:   CHIEF COMPLAINT / HPI:   Pt is a 46 y.o. yo female who presents for a well woman exam.  Concerns: right ankle swelling-   Menstrual Hx: LMP *** Obstetrical Hx: G***P*** - Pregnancy complications: - Cesarian sections: Family Planning: Does ***desire pregnancy Folic acid: Currently ***taking PNV Contraception: Currently using ***, has used *** in the past. Sexual Hx: Sexually ***active, *** partners. - H/o STIs: *** Gynecologic Procedures/Surgeries: *** Urinary incontinence symptoms: *** Other GYN history: No hx of fibroids, endometriosis, ovarian cysts, PCOS, or infertility. Diet: *** Exercise: *** IPV: Pt feels same at home and in current relationships, no past hx of emotional/physical/sexual abuse.  Pap smear: Last one 07/2018, results NILM HPV neg- normal Colonoscopy: never had Mammogram: Last one ***, results ***normal DEXA scan: Last one ***, results *** normal Breast Cancer Risk Asssessment %age (for SERM prevention): *** % BRCA Risk: ***  Family Hx: - Breast cancer: *** - Endometrial cancer: *** - Cervical cancer: *** - Ovarian Cancer*** - Colon Cancer: ***   PERTINENT  PMH / PSH: ***  OBJECTIVE:   There were no vitals taken for this visit.  General: A&O, NAD HEENT: No sign of trauma, EOM grossly intact Cardiac: RRR, no m/r/g Respiratory: CTAB, normal WOB, no w/c/r GI: Soft, NTTP, non-distended  Extremities: NTTP, no peripheral edema. Neuro: Normal gait, moves all four extremities appropriately. Psych: Appropriate mood and affect   ASSESSMENT/PLAN:   No problem-specific Assessment & Plan notes found for this encounter.     Margaret E Pray, MD [facility]  

## 2022-05-29 ENCOUNTER — Encounter: Payer: Self-pay | Admitting: Internal Medicine

## 2022-07-13 ENCOUNTER — Ambulatory Visit: Payer: PRIVATE HEALTH INSURANCE | Admitting: Internal Medicine

## 2022-07-20 ENCOUNTER — Ambulatory Visit (INDEPENDENT_AMBULATORY_CARE_PROVIDER_SITE_OTHER): Payer: PRIVATE HEALTH INSURANCE | Admitting: Family Medicine

## 2022-07-20 ENCOUNTER — Ambulatory Visit (HOSPITAL_COMMUNITY)
Admission: RE | Admit: 2022-07-20 | Discharge: 2022-07-20 | Disposition: A | Discharge status: Home / Self Care | Payer: PRIVATE HEALTH INSURANCE | Source: Ambulatory Visit | Attending: Family Medicine | Admitting: Family Medicine

## 2022-07-20 ENCOUNTER — Encounter: Payer: Self-pay | Admitting: Family Medicine

## 2022-07-20 VITALS — BP 98/72 | HR 63 | Ht 63.0 in | Wt 150.2 lb

## 2022-07-20 DIAGNOSIS — M25571 Pain in right ankle and joints of right foot: Secondary | ICD-10-CM

## 2022-07-20 DIAGNOSIS — Z1231 Encounter for screening mammogram for malignant neoplasm of breast: Secondary | ICD-10-CM

## 2022-07-20 DIAGNOSIS — G8929 Other chronic pain: Secondary | ICD-10-CM | POA: Diagnosis present

## 2022-07-20 DIAGNOSIS — B360 Pityriasis versicolor: Secondary | ICD-10-CM

## 2022-07-20 DIAGNOSIS — Z01419 Encounter for gynecological examination (general) (routine) without abnormal findings: Secondary | ICD-10-CM

## 2022-07-20 MED ORDER — CLOTRIMAZOLE 1 % EX OINT: 1.0000 | TOPICAL_OINTMENT | Freq: Every day | CUTANEOUS | 1 refills | Status: DC | PRN

## 2022-07-20 MED ORDER — VITAMIN B-12 100 MCG PO TABS: 100.0000 ug | ORAL_TABLET | Freq: Every day | ORAL | 3 refills | Status: DC

## 2022-07-20 NOTE — Progress Notes (Signed)
    SUBJECTIVE:   CHIEF COMPLAINT / HPI:   Pt is a 46 y.o. yo female who presents for a well woman exam.  Menstrual Hx: LMP 07/13/22 Obstetrical Hx: G2P2 - Pregnancy complications: none - Cesarian sections: none Family Planning: Does not desire pregnancy Contraception: Currently using condoms, has used others in the past. Sexual Hx: Sexually active, one partners. - H/o STIs: none Gynecologic Procedures/Surgeries: none Urinary incontinence symptoms: sometimes incontinence stress with sneezing and coughing, sometimes some urgency Other GYN history: No hx of fibroids, endometriosis, ovarian cysts, PCOS, or infertility. Diet: vegetables with brown rice, actually vegetarian Exercise: 4x days a week goes to gym IPV: Pt feels same at home and in current relationships, no past hx of emotional/physical/sexual abuse.  Pap smear: Last one 07/2018, results NILM HPV neg, due in 2024 Colonoscopy: DUE, never had before, has appt coming up 08/10/22 Mammogram: never had, offered today, no breast pain or discharge DEXA scan: n/a  Family Hx: - Breast cancer: none - Endometrial cancer: none - Cervical cancer: none - Ovarian Cancer: none - Colon Cancer: none  Rheumatic heart disease-  no SOA or chest pain, able to go to gym without symptoms. Taking metoprolol. Sometimes has leg swelling but wears compression stockings. Tolerates gym without symptoms.  Right ankle swelling- medial posterior malleolus, better with compression stockings. Sometimes some crunching sounds heard when she moves it around. No redness or Achilles tendon pain. No fracture or previous surgery there.   PERTINENT  PMH / PSH: rheumatic heart disease with mitral stenosis, diastolic heart failure  OBJECTIVE:   BP 98/72   Pulse 63   Ht '5\' 3"'$  (1.6 m)   Wt 150 lb 3.2 oz (68.1 kg)   LMP 07/13/2022 (Exact Date)   BMI 26.61 kg/m   General: A&O, NAD HEENT: No sign of trauma, EOM grossly intact Cardiac: RRR, + murmur at LLSB,  no gallops Respiratory: CTAB, normal WOB, no w/c/r GI: Soft, NTTP, non-distended  Extremities: NTTP, no peripheral edema. Neuro: Normal gait, moves all four extremities appropriately. Psych: Appropriate mood and affect   ASSESSMENT/PLAN:   Well woman exam - screening mammogram ordered - offered colonoscopy, has appt with GI in November and wants to discuss it then - checking lipid panel today  Ankle pain, right - suspect arthritis, no history of trauma, no joint redness or tenderness today - XR ordered, discussed PRN ibuprofen for pain     Lenoria Chime, MD [place]

## 2022-07-20 NOTE — Assessment & Plan Note (Signed)
-   suspect arthritis, no history of trauma, no joint redness or tenderness today - XR ordered, discussed PRN ibuprofen for pain

## 2022-07-20 NOTE — Patient Instructions (Addendum)
It was wonderful to see you today.  Please bring ALL of your medications with you to every visit.   Today we talked about:  You can call the GI breast center to schedule a screening mammogram.  GO get X ray today of ankle at [facility]  We checked your cholesterol today.   Thank you for choosing [facility].   Please call [phone] with any questions about today's appointment.  Please be sure to schedule follow up at the front  desk before you leave today.   Please arrive at least 15 minutes prior to your scheduled appointments.   If you had blood work today, I will send you a MyChart message or a letter if results are normal. Otherwise, I will give you a call.   If you had a referral placed, they will call you to set up an appointment. Please give [place] a call if you don't hear back in the next 2 weeks.   If you need additional refills before your next appointment, please call your pharmacy first.   Yehuda Savannah, MD  Family Medicine

## 2022-07-20 NOTE — Assessment & Plan Note (Addendum)
-   screening mammogram ordered - offered colonoscopy, has appt with GI in November and wants to discuss it then - checking lipid panel today

## 2022-07-21 LAB — LIPID PANEL
Chol/HDL Ratio: 4 ratio (ref 0.0–4.4)
Cholesterol, Total: 169 mg/dL (ref 100–199)
HDL: 42 mg/dL (ref >39)
LDL Chol Calc (NIH): 104 mg/dL — ABNORMAL HIGH (ref 0–99)
Triglycerides: 131 mg/dL (ref 0–149)
VLDL Cholesterol Cal: 23 mg/dL (ref 5–40)

## 2022-07-23 ENCOUNTER — Telehealth: Payer: Self-pay

## 2022-07-23 MED ORDER — CLOTRIMAZOLE 1 % EX CREA: 1.0000 | TOPICAL_CREAM | Freq: Two times a day (BID) | CUTANEOUS | 2 refills | Status: DC

## 2022-07-23 NOTE — Telephone Encounter (Signed)
Request from pharmacy:  For the Clotrimazole 1% ointment, can we use cream. Please send new Rx. Ottis Stain, CMA

## 2022-07-23 NOTE — Telephone Encounter (Signed)
Rx sent as requested.

## 2022-08-01 ENCOUNTER — Telehealth: Payer: Self-pay

## 2022-08-01 NOTE — Telephone Encounter (Signed)
Patient calls nurse line regarding cholesterol results and ankle x-ray results. Advised of normal results per notes from Dr. Thompson Grayer.   Patient is also asking if mammogram order can be sent to imaging center in [facility]. Patient is unsure which imaging center is preferred with her insurance. She will call back and provide with office information.   Talbot Grumbling, RN

## 2022-08-02 NOTE — Telephone Encounter (Signed)
Patient returns call for mammogram.   Patient reports the location she found dose not need a referral from [place].  Patient reports she will call if anything changes.

## 2022-08-10 ENCOUNTER — Ambulatory Visit: Payer: PRIVATE HEALTH INSURANCE | Admitting: Internal Medicine

## 2022-10-17 ENCOUNTER — Telehealth: Payer: Self-pay

## 2022-10-17 NOTE — Telephone Encounter (Signed)
Patient calls nurse line requesting medication for cough. She states that she tested positive for the flu on 10/09/22. She received tessalon capsules at this time. She reports that cough improved, however, returned on Saturday 1/6. States that cough is dry and causes her to have a headache. Denies fever, chills, body aches, or shortness of breath.   Advised that she would likely need an appointment for further evaluation. Offered to schedule on Friday with PCP. Patient declines to schedule at this time and is asking if request can be sent to PCP.   If possible, she is requesting that cough medication be sent to [facility] [address] [facility].   Provided with supportive measures.   Talbot Grumbling, RN

## 2022-10-18 NOTE — Telephone Encounter (Signed)
Patient returns call to nurse line. Advised that she will need to schedule appointment. Offered appointment for tomorrow, patient unable to schedule due to work schedule.   Scheduled with PCP on 1/26. (Offered to schedule sooner with another provider, patient declined and requested to schedule with PCP)  Talbot Grumbling, RN

## 2022-10-20 ENCOUNTER — Ambulatory Visit (HOSPITAL_COMMUNITY)
Admission: EM | Admit: 2022-10-20 | Discharge: 2022-10-20 | Disposition: A | Discharge status: Home / Self Care | Payer: PRIVATE HEALTH INSURANCE | Attending: Family Medicine | Admitting: Family Medicine

## 2022-10-20 ENCOUNTER — Other Ambulatory Visit: Payer: Self-pay

## 2022-10-20 ENCOUNTER — Ambulatory Visit (INDEPENDENT_AMBULATORY_CARE_PROVIDER_SITE_OTHER): Payer: PRIVATE HEALTH INSURANCE

## 2022-10-20 ENCOUNTER — Encounter (HOSPITAL_COMMUNITY): Payer: Self-pay | Admitting: Emergency Medicine

## 2022-10-20 DIAGNOSIS — R051 Acute cough: Secondary | ICD-10-CM

## 2022-10-20 DIAGNOSIS — R059 Cough, unspecified: Secondary | ICD-10-CM

## 2022-10-20 DIAGNOSIS — J069 Acute upper respiratory infection, unspecified: Secondary | ICD-10-CM | POA: Diagnosis not present

## 2022-10-20 MED ORDER — HYDROCODONE BIT-HOMATROP MBR 5-1.5 MG/5ML PO SOLN: 5.0000 mL | Freq: Four times a day (QID) | ORAL | 0 refills | Status: DC | PRN

## 2022-10-20 NOTE — Discharge Instructions (Addendum)

## 2022-10-20 NOTE — ED Triage Notes (Addendum)
Complains of cough and headache.  Reports a positive flu on 10/09/2022.  Took tamaflu, felt better.  Cough returned approximately on week ago.  Minimal runny nose.    Has taken tylenol-last dose yesterday

## 2022-10-20 NOTE — ED Provider Notes (Signed)
[place]   [id number] 10/20/22 Arrival Time: 5093  ASSESSMENT & PLAN:  1. Acute cough   2. Viral URI with cough    Discussed typical duration of likely viral illness. Given recent influenza, ordered CXR. I have personally viewed the imaging studies ordered this visit. No signs of PNA.  OTC symptom care as needed.  New Prescriptions   HYDROCODONE BIT-HOMATROPINE (HYCODAN) 5-1.5 MG/5ML SYRUP    Take 5 mLs by mouth every 6 (six) hours as needed for cough.     Follow-up Information     Pray, Norwood Levo, MD.   Specialty: Family Medicine Why: If worsening or failing to improve as anticipated. Contact information: [place] [address] [phone]                 Reviewed expectations re: course of current medical issues. Questions answered. Outlined signs and symptoms indicating need for more acute intervention. Understanding verbalized. After Visit Summary given.   SUBJECTIVE: History from: Patient. Megan Atkins is a 47 y.o. female. Reports: cough and headache. Reports a positive flu on 10/09/2022.  Took tamaflu, felt better. Cough returned approximately on week ago. Minimal runny nose. Denies fever. Tylenol with mild help. Denies SOB/CP.   OBJECTIVE:  Vitals:   10/20/22 1147  BP: 111/69  Pulse: 64  Resp: 18  Temp: 97.8 F (36.6 C)  TempSrc: Oral  SpO2: 97%    General appearance: alert; no distress Eyes: PERRLA; EOMI; conjunctiva normal HENT: [place]; AT; with nasal congestion Neck: supple  Lungs: speaks full sentences without difficulty; unlabored; slightly coarse breaths sounds bilat; no wheezing Extremities: no edema Skin: warm and dry Neurologic: normal gait Psychological: alert and cooperative; normal mood and affect   Imaging: DG Chest 2 View  Result Date: 10/20/2022 CLINICAL DATA:  Complains of cough and headache. Reports a positive flu on 10/09/2022. EXAM: CHEST - 2 VIEW COMPARISON:  Chest radiograph 10/10/2012  FINDINGS: The heart size and mediastinal contours are within normal limits. Slightly increased diffuse bilateral interstitial markings. No focal consolidation. No pneumothorax or pleural effusion. Regional skeleton is unremarkable. IMPRESSION: Slightly increased diffuse bilateral interstitial markings could represent atypical/viral infection. Electronically Signed   By: Audie Pinto M.D.   On: 10/20/2022 12:14    No Known Allergies  Past Medical History:  Diagnosis Date   Hemorrhoid 11/2012   Mitral stenosis    last echo 05/2011 at [facility]   Palpitations    Rheumatic heart disease    Social History   Socioeconomic History   Marital status: Married    Spouse name: Not on file   Number of children: Not on file   Years of education: Not on file   Highest education level: Not on file  Occupational History   Not on file  Tobacco Use   Smoking status: Never   Smokeless tobacco: Never  Vaping Use   Vaping Use: Never used  Substance and Sexual Activity   Alcohol use: No   Drug use: No   Sexual activity: Yes  Other Topics Concern   Not on file  Social History Narrative   Moved here from [place] in 2010.   Diagnosed with Rheumatic heart disease prior to arrival    Social Determinants of Health   Financial Resource Strain: Not on file  Food Insecurity: Not on file  Transportation Needs: Not on file  Physical Activity: Not on file  Stress: Not on file  Social Connections: Not on file  Intimate Partner Violence:  Not on file   Family History  Family history unknown: Yes   Past Surgical History:  Procedure Laterality Date   HEMORRHOID SURGERY N/A 11/18/2012   Procedure: HEMORRHOIDECTOMY;  Surgeon: Imogene Burn. Georgette Dover, MD;  Location: [facility];  Service: General;  Laterality: N/A;   NO PAST SURGERIES       Vanessa Kick, MD 10/20/22 1323

## 2022-11-02 ENCOUNTER — Ambulatory Visit: Payer: PRIVATE HEALTH INSURANCE | Admitting: Family Medicine

## 2022-11-09 NOTE — Progress Notes (Deleted)
Date:  11/09/2022   ID:  Megan Atkins, Megan Atkins [birth year], MRN [id number]   PCP:  Lenoria Chime, MD  Cardiologist:   Johnsie Cancel Electrophysiologist:  None   Evaluation Performed:  Follow-Up Visit  Chief Complaint:  Rheumatic mitral valve disease  History of Present Illness:    47 y.o. with history of moderate MS and rheumatic mitral valve disease Echo March 2018 reviewed EF 60-65% moderate MS mild MR Valve area by PT1/2 1.35 cm2  Moderate LAE estimated PA 39 mmHg mean gradient 9 mmHg peak 10 mmHg History of palpitations with no PAF documented by monitor 2018, 2020 and more recently 03/01/22 Seen by GT for self limited SVT no Rx other than beta blocker suggested   ETT 11/04/20 only 67% PMHR no arrhythmia or ST changes  TTE 11/04/20 EF strain normal moderate LAE mild RAE MVA 1.3 cm2 mild MR Mild/Mod MS mean gradient 8.5 mmHg  Moderate TR   TTE 04/04/22 severe LAE mild/mod MR moderate/severe MS mean gradient 10 mmHg MVA 1.27 cm2 but < 1 cm2 by VTI and peak gradient 18 mmHg at HR of 74 bpm mild elevation in PA systolic pressure   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 She is working full time Only gets mild dyspnea going up stairs. Feels improved with lopressor   Discussed timing of MVR.  She is still young and would be nice to wait as long as possible Given lack of CHF, PAF and symptoms favor continued beta blocker Rx and serial TTE;s     Past Medical History:  Diagnosis Date   Hemorrhoid 11/2012   Mitral stenosis    last echo 05/2011 at [facility]   Palpitations    Rheumatic heart disease    Past Surgical History:  Procedure Laterality Date   HEMORRHOID SURGERY N/A 11/18/2012   Procedure: HEMORRHOIDECTOMY;  Surgeon: Imogene Burn. Georgette Dover, MD;  Location: [facility];  Service: General;  Laterality: N/A;   NO PAST SURGERIES       No outpatient medications have been marked as taking for the 11/16/22 encounter (Appointment) with  [name], MD.     Allergies:   Patient has no known allergies.   Social History   Tobacco Use   Smoking status: Never   Smokeless tobacco: Never  Vaping Use   Vaping Use: Never used  Substance Use Topics   Alcohol use: No   Drug use: No     Family Hx: The patient's Family history is unknown by patient.  ROS:   Please see the history of present illness.     All other systems reviewed and are negative.   Prior CV studies:   The following studies were reviewed today:  TTE 04/04/22  Monitor 03/21/22   Labs/Other Tests and Data Reviewed:    EKG:  NSR rate 67 normal 07/28/20 SR ICRBBB rate 70 LAE otherwise normal 11/09/2022 NSR rate 59 normal   Recent Labs: 01/10/2022: ALT 16; BUN 7; Creatinine, Ser 0.68; Hemoglobin 14.1; Platelets 218; Potassium 4.4; Sodium 141   Recent Lipid Panel Lab Results  Component Value Date/Time   CHOL 169 07/20/2022 12:14 PM   TRIG 131 07/20/2022 12:14 PM   HDL 42 07/20/2022 12:14 PM   CHOLHDL 4.0 07/20/2022 12:14 PM   LDLCALC 104 (H) 07/20/2022 12:14 PM    Wt Readings from Last 3 Encounters:  07/20/22 150 lb 3.2 oz (68.1 kg)  05/23/22 149 lb 12.8  oz (67.9 kg)  05/08/22 155 lb (70.3 kg)     Objective:    Vital Signs:  LMP 10/15/2022    Affect appropriate Healthy:  appears stated age 89: normal Neck supple with no adenopathy JVP normal no bruits no thyromegaly Lungs clear with no wheezing and good diaphragmatic motion Heart:  S1/S2 no murmur, no rub, gallop or click PMI normal Abdomen: benighn, BS positve, no tenderness, no AAA no bruit.  No HSM or HJR Distal pulses intact with no bruits No edema Neuro non-focal Skin warm and dry No muscular weakness   ASSESSMENT & PLAN:    1. Mitral Stenosis:  She has been asymptomatic without PAF or severe elevation in estimated PA pressures by echo has been on beta blockers Progression by TTE done 04/04/22 but asymptomatic with no clinical CHF/PAF  Continue lopressor f/u in 6  months   2. Palpitations :  No documented PAF on multiple monitors most recently 03/01/22 seen by EP Dr Lovena Le self limited SVT continue lopressor   COVID-19 Education: The signs and symptoms of COVID-19 were discussed with the patient and how to seek care for testing (follow up with PCP or arrange E-visit).  The importance of social distancing was discussed today.  Time:   Today, I have spent 30 minutes with the patient with telehealth technology discussing the above problems.     Medication Adjustments/Labs and Tests Ordered: Current medicines are reviewed at length with the patient today.  Concerns regarding medicines are outlined above.   Tests Ordered:  Echo June 2024   Medication Changes:  None   Disposition:  Follow up 6 months   Signed, Jenkins Rouge, MD  11/09/2022 4:53 PM    [facility]

## 2022-11-16 ENCOUNTER — Ambulatory Visit: Payer: PRIVATE HEALTH INSURANCE | Admitting: Cardiovascular Disease

## 2022-11-23 ENCOUNTER — Ambulatory Visit: Payer: PRIVATE HEALTH INSURANCE | Admitting: Family Medicine

## 2022-12-07 ENCOUNTER — Ambulatory Visit: Payer: PRIVATE HEALTH INSURANCE | Admitting: Family Medicine

## 2022-12-07 VITALS — BP 110/72 | HR 74 | Wt 152.2 lb

## 2022-12-07 DIAGNOSIS — R058 Other specified cough: Secondary | ICD-10-CM

## 2022-12-07 DIAGNOSIS — L603 Nail dystrophy: Secondary | ICD-10-CM

## 2022-12-07 MED ORDER — BENZONATATE 100 MG PO CAPS: 100.0000 mg | ORAL_CAPSULE | Freq: Three times a day (TID) | ORAL | 1 refills | Status: DC | PRN

## 2022-12-07 NOTE — Assessment & Plan Note (Signed)
Improving, discussed avoidance of codeine containing products, PRN Tessalon Pearles provided, discussed return precuations

## 2022-12-07 NOTE — Patient Instructions (Addendum)
It was wonderful to see you today.  Please bring ALL of your medications with you to every visit.   Today we talked about:  You can take biotin or prenatal vitamin with folic acid to help with nail strength. We will check your thryoid today.  I sent you in Tessalon Pearls '100mg'$  three times a day as needed for cough. If you start having fevers, chills, sputum production please let us know. I'm glad it is getting better.   Thank you for choosing [facility].   Please call [phone] with any questions about today's appointment.  Please arrive at least 15 minutes prior to your scheduled appointments.   If you had blood work today, I will send you a MyChart message or a letter if results are normal. Otherwise, I will give you a call.   If you had a referral placed, they will call you to set up an appointment. Please give [place] a call if you don't hear back in the next 2 weeks.   If you need additional refills before your next appointment, please call your pharmacy first.   Yehuda Savannah, MD  Family Medicine

## 2022-12-07 NOTE — Assessment & Plan Note (Signed)
Discussed biotin and folic acid, checking TSH

## 2022-12-07 NOTE — Progress Notes (Signed)
    SUBJECTIVE:   CHIEF COMPLAINT / HPI:   Coughing- getting better, but has been coughing since she had Influenza last month. Received a codeine cough syrup that really helped and was wondering if she could have a refill. No fevers, chills, sputum production. NO dyspnea or chest pain with walking around.  Brittle nails- has been going on for years. Notes them chipping. Wondering if there is anything she can take for that.   PERTINENT  PMH / PSH: rheumatic heart disease with mitral stenosis, diastolic heart failure  OBJECTIVE:   BP 110/72   Pulse 74   Wt 152 lb 3.2 oz (69 kg)   LMP 11/14/2022   SpO2 99%   BMI 26.96 kg/m   General: alert & oriented, no apparent distress, well groomed HEENT: normocephalic, atraumatic, EOM grossly intact, oral mucosa moist, neck supple Respiratory: normal respiratory effort Cardiac: RRR, + murmur at LLSB, no gallops  Respiratory: Lungs CTAB, no wheezes or rales GI: non-distended Skin: no rashes, no jaundice, nails thin, some chipped, no pitting or discoloration but limited as nailpolish in place Psych: appropriate mood and affect   ASSESSMENT/PLAN:   Post-viral cough syndrome Improving, discussed avoidance of codeine containing products, PRN Tessalon Pearles provided, discussed return precuations  Brittle nails Discussed biotin and folic acid, checking TSH     Lenoria Chime, MD [place]

## 2022-12-08 LAB — TSH RFX ON ABNORMAL TO FREE T4: TSH: 1.23 u[IU]/mL (ref 0.450–4.500)

## 2023-03-12 IMAGING — US US SOFT TISSUE
1 series · 14 of 16 positions shown · non-contrast
Comparison: None.

CLINICAL DATA: Skin lumps.

EXAM:
ULTRASOUND OF ABDOMINAL WALL SOFT TISSUES
TECHNIQUE: Ultrasound examination was performed in the area of clinical
concern.

[Series 1: us chest soft tissue · 14 of 39 slices shown]
[im 1/39]
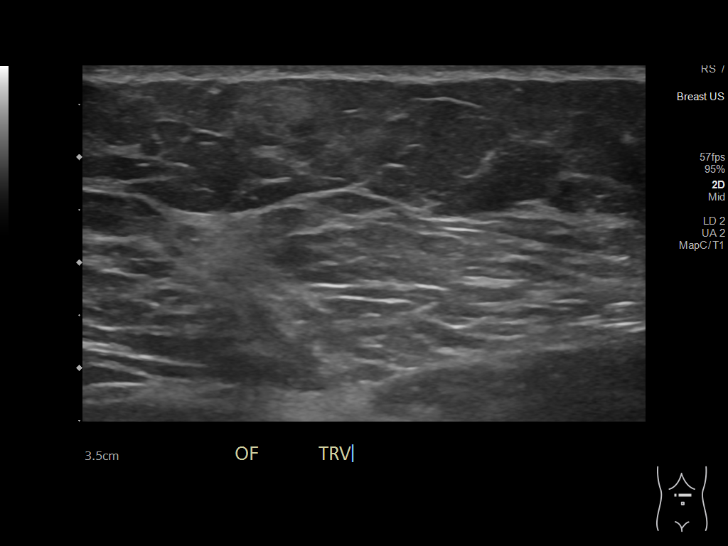
[im 3/39]
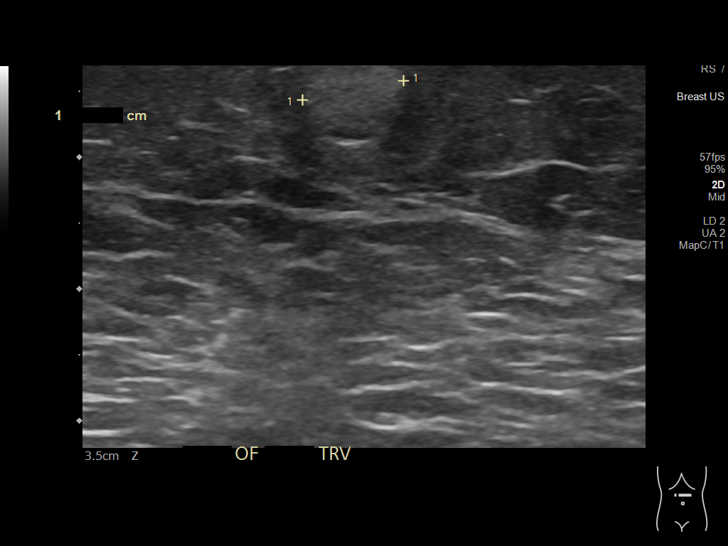
[im 6/39]
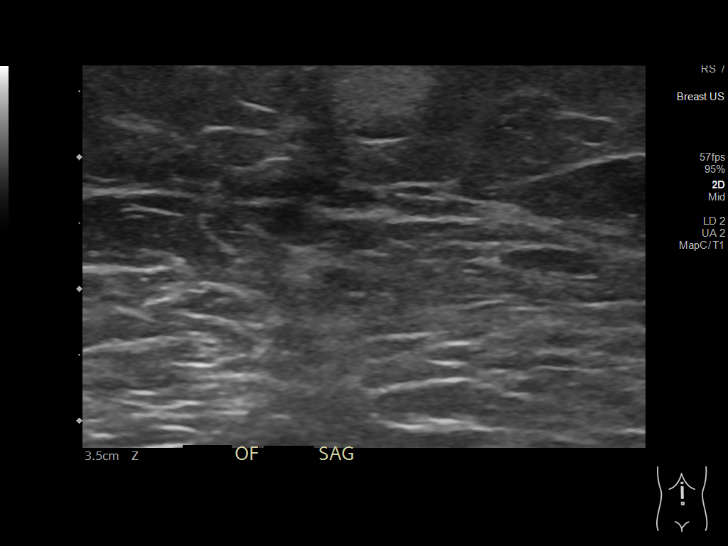
[im 11/39]
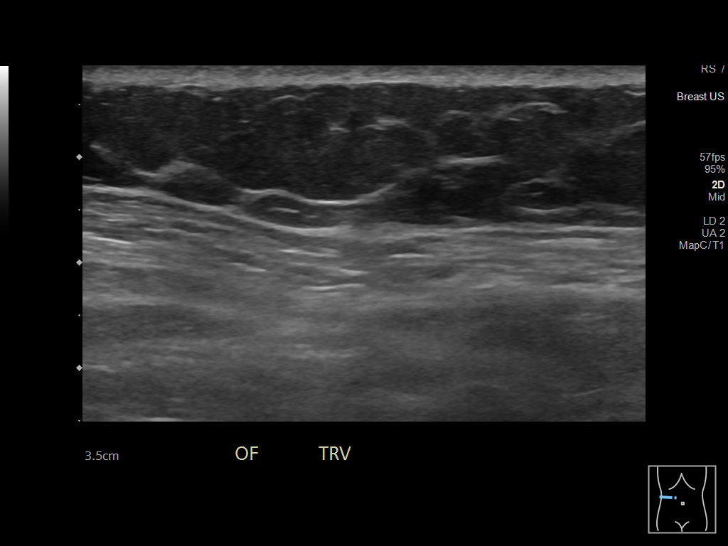
[im 13/39]
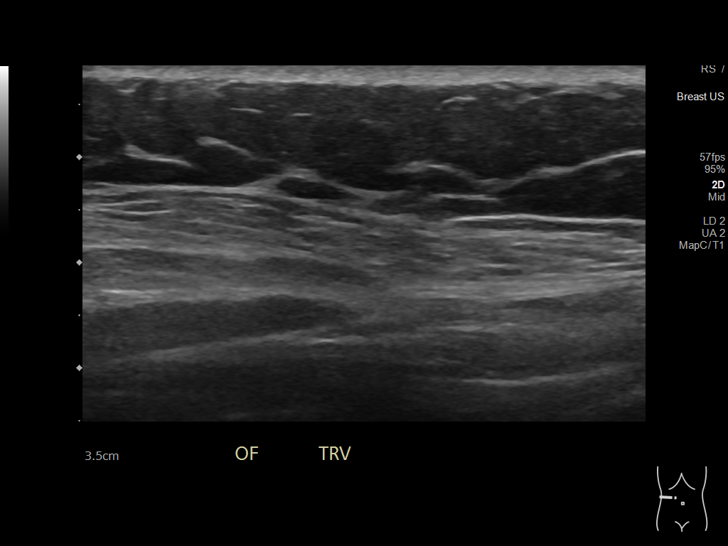
[im 16/39]
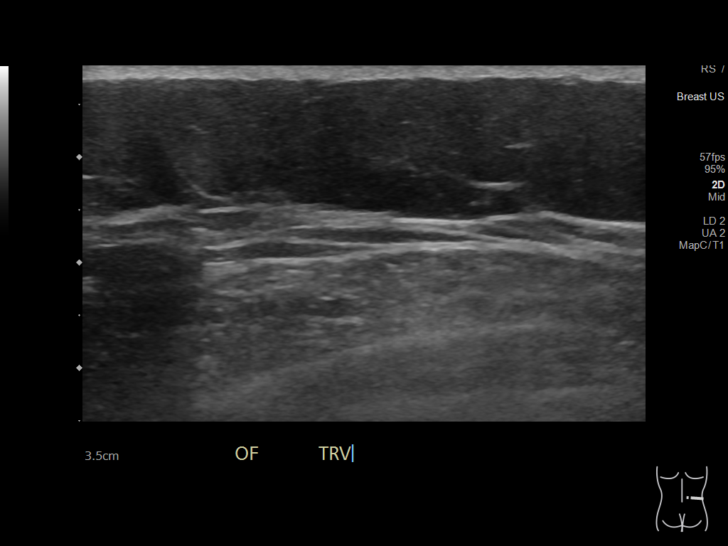
[im 18/39]
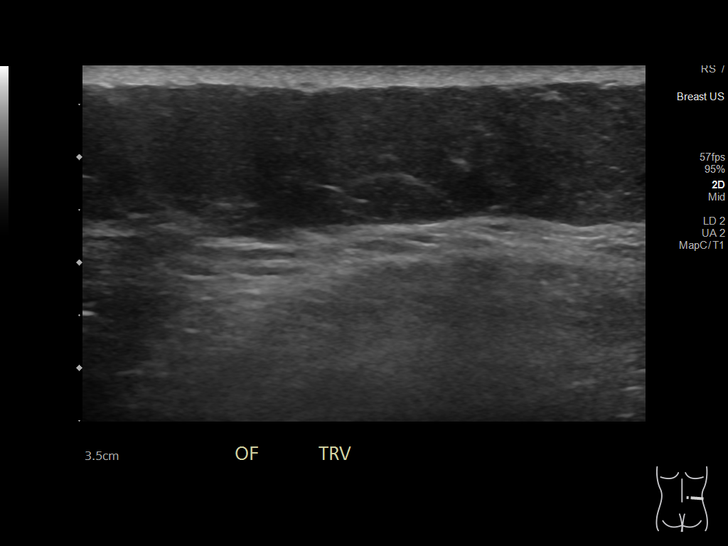
[im 21/39]
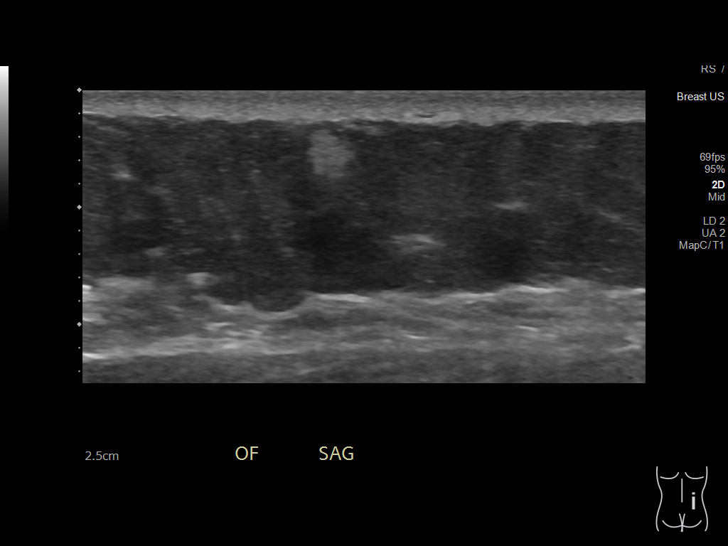
[im 23/39]
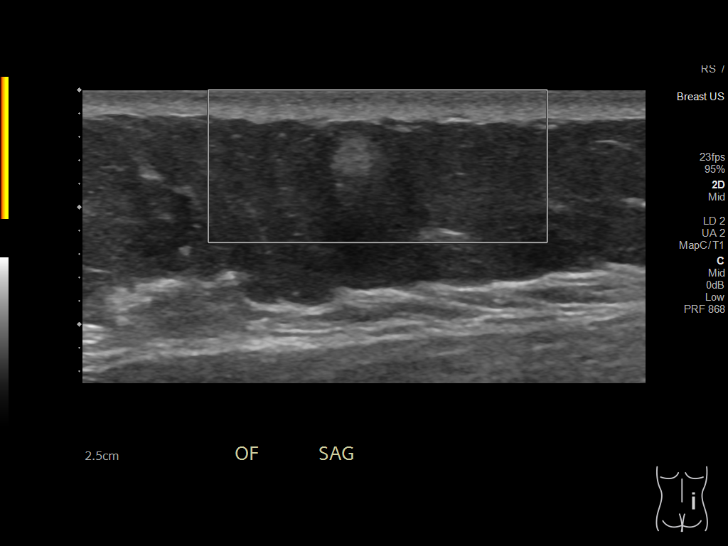
[im 26/39]
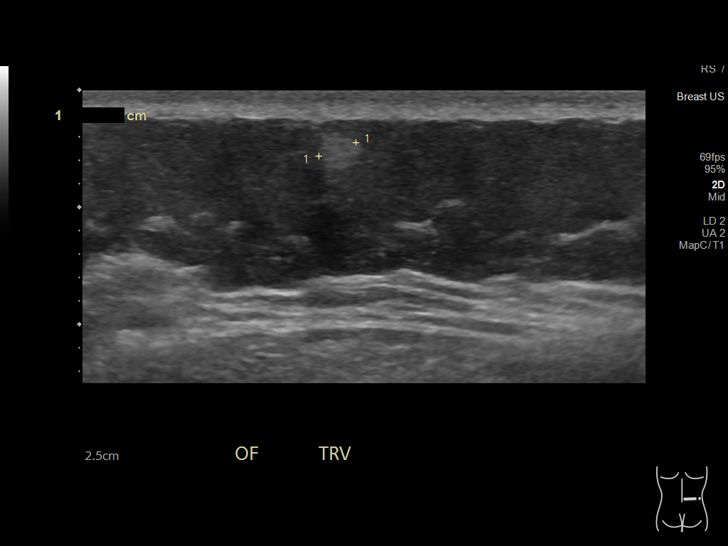
[im 31/39]
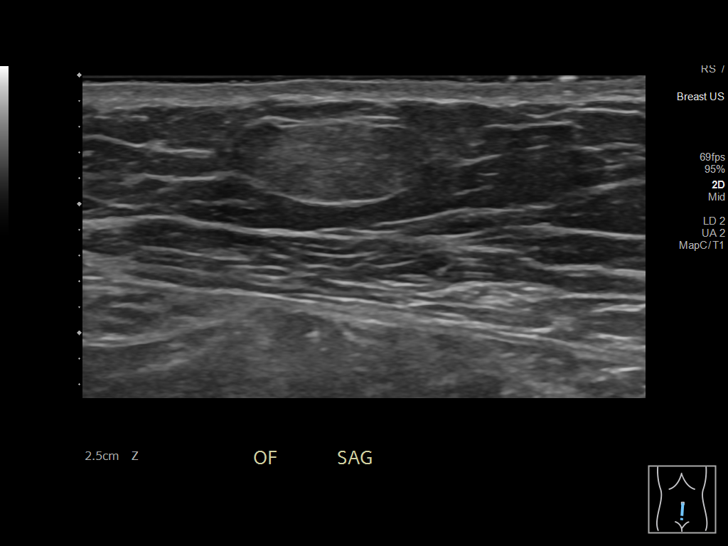
[im 33/39]
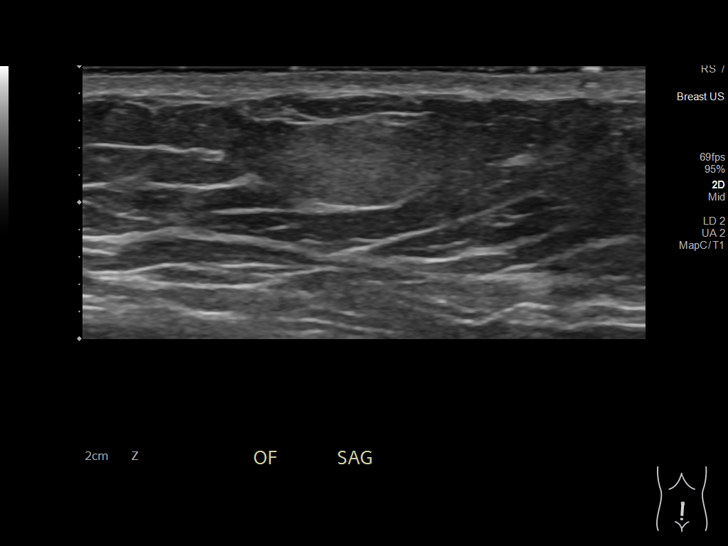
[im 36/39]
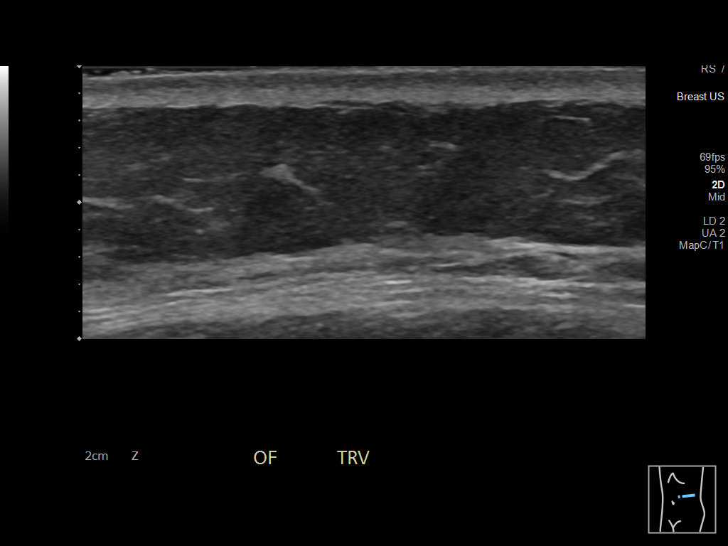
[im 39/39]
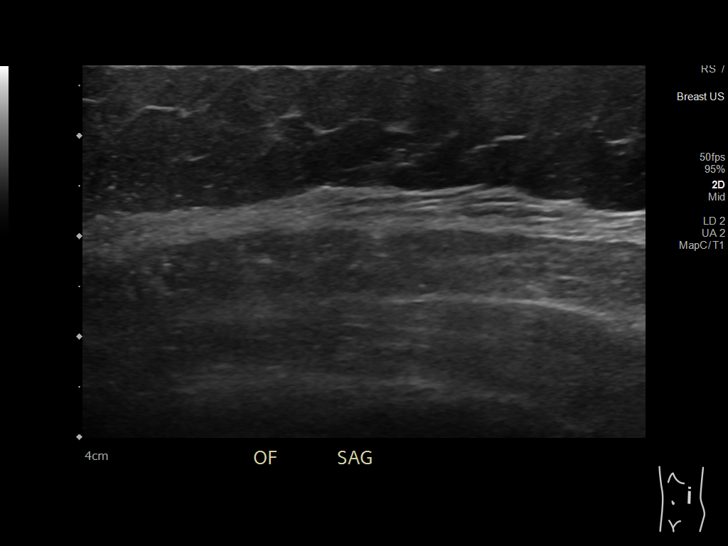

[14 of 16 positions shown; findings below may reference images not displayed]

FINDINGS: Targeted sonographic images of the soft tissues of the anterior
abdominal wall in the region of the clinical concern was performed.

Several echogenic lesions with the largest measuring 12 x 10 x 7 mm
in the subcutaneous soft tissues of the anterior abdominal wall
corresponding to the areas of palpable concern. No internal
vascularity noted within these lesions. The sonographic features are
most consistent with lipoma. Clinical correlation is recommended. If
there is continued growth or other clinical findings of concern, MRI
may provide better characterization.

No drainable fluid collection.  No hernia.
IMPRESSION: Probable small subcutaneous lipomas.

## 2023-03-19 ENCOUNTER — Other Ambulatory Visit: Payer: Self-pay

## 2023-03-19 ENCOUNTER — Other Ambulatory Visit: Payer: Self-pay | Admitting: Family Medicine

## 2023-03-19 MED ORDER — METOPROLOL TARTRATE 50 MG PO TABS: 50.0000 mg | ORAL_TABLET | Freq: Two times a day (BID) | ORAL | 0 refills | Status: DC

## 2023-03-20 ENCOUNTER — Other Ambulatory Visit: Payer: Self-pay | Admitting: Family Medicine

## 2023-03-20 DIAGNOSIS — Z01419 Encounter for gynecological examination (general) (routine) without abnormal findings: Secondary | ICD-10-CM

## 2023-04-08 ENCOUNTER — Ambulatory Visit: Payer: PRIVATE HEALTH INSURANCE | Admitting: Cardiovascular Disease

## 2023-04-08 ENCOUNTER — Other Ambulatory Visit: Payer: Self-pay | Admitting: Family Medicine

## 2023-04-08 DIAGNOSIS — Z01419 Encounter for gynecological examination (general) (routine) without abnormal findings: Secondary | ICD-10-CM

## 2023-04-09 ENCOUNTER — Telehealth: Payer: Self-pay

## 2023-04-09 NOTE — Telephone Encounter (Signed)
VM left asking patient to schedule an apt for evaluation and medication refill.

## 2023-04-09 NOTE — Telephone Encounter (Signed)
Patient calls nurse line in regards to Vitamin B12 and Clotrimazole-Betamethasone cream.   She reports she requested a refill last week, however has not heard anything.   Patient advised both medications were denied by PCP.   She reports the rash she had has returned and is "very itchy." She reports the rash is all over her legs and spreading to her "backside."   Will forward to PCP to advise on requests.   I called the pharmacy and they report she picked up B12 on 5/13, therefore she is not eligible for a refill until 8/13.

## 2023-04-12 NOTE — Progress Notes (Addendum)
Date:  04/25/2023   ID:  Megan Atkins, DOB [birth year], MRN [id number]   PCP:  Billey Co, MD  Cardiologist:   Eden Emms Electrophysiologist:  None   Evaluation Performed:  Follow-Up Visit  Chief Complaint:  Rheumatic mitral valve disease  History of Present Illness:    47 y.o. with history of moderate MS and rheumatic mitral valve disease Echo March 2018 reviewed EF 60-65% moderate MS mild MR Valve area by PT1/2 1.35 cm2  Moderate LAE estimated PA 39 mmHg mean gradient 9 mmHg peak 10 mmHg History of palpitations with no PAF documented by monitor 2018, 2020 and more recently 03/01/22 Seen by GT for self limited SVT no Rx other than beta blocker suggested   ETT 11/04/20 only 67% PMHR no arrhythmia or ST changes  TTE 11/04/20 EF strain normal moderate LAE mild RAE MVA 1.3 cm2 mild MR Mild/Mod MS mean gradient 8.5 mmHg  Moderate TR   TTE 04/04/22 severe LAE mild/mod MR moderate/severe MS mean gradient 10 mmHg MVA 1.27 cm2 but < 1 cm2 by VTI and peak gradient 18 mmHg at HR of 74 bpm mild elevation in PA systolic pressure   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 She is working full time Only gets mild dyspnea going up stairs. Feels improved with lopressor   Discussed timing of MVR.  She is still young and would be nice to wait as long as possible Given lack of CHF, PAF and symptoms favor continued beta blocker Rx and serial TTE;s   She has some fatigue and tiredness and wonders if it is from lopressor However she has been on this for a long time and new symptoms only this year She did not like Atenolol in past ? Reason Discussed trying Nadolol     Past Medical History:  Diagnosis Date   Hemorrhoid 11/2012   Mitral stenosis    last echo 05/2011 at [facility]   Palpitations    Rheumatic heart disease    Past Surgical History:  Procedure Laterality Date   HEMORRHOID SURGERY N/A 11/18/2012   Procedure: HEMORRHOIDECTOMY;  Surgeon:  Wilmon Arms. Tsuei, MD;  Location: [facility];  Service: General;  Laterality: N/A;   NO PAST SURGERIES       Current Meds  Medication Sig   benzonatate (TESSALON PERLES) 100 MG capsule Take 1 capsule (100 mg total) by mouth 3 (three) times daily as needed for cough.   clotrimazole (LOTRIMIN) 1 % cream APPLY TO AFFECTED AREA TWICE A DAY   guaiFENesin-codeine 100-10 MG/5ML syrup Take by mouth.   HYDROcodone bit-homatropine (HYCODAN) 5-1.5 MG/5ML syrup Take 5 mLs by mouth every 6 (six) hours as needed for cough.   metoprolol tartrate (LOPRESSOR) 50 MG tablet Take 1 tablet (50 mg total) by mouth 2 (two) times daily.   timolol (BETIMOL) 0.5 % ophthalmic solution 1 drop 2 (two) times daily.   timolol (TIMOPTIC) 0.5 % ophthalmic solution Apply to eye.   triamcinolone cream (KENALOG) 0.1 % Apply topically.   vitamin B-12 (CYANOCOBALAMIN) 100 MCG tablet Take 1 tablet (100 mcg total) by mouth daily.     Allergies:   Patient has no known allergies.   Social History   Tobacco Use   Smoking status: Never   Smokeless tobacco: Never  Vaping Use   Vaping status: Never Used  Substance Use Topics   Alcohol use: No   Drug use: No  Family Hx: The patient's Family history is unknown by patient.  ROS:   Please see the history of present illness.     All other systems reviewed and are negative.   Prior CV studies:   The following studies were reviewed today:  TTE 04/04/22  Monitor 03/21/22   Labs/Other Tests and Data Reviewed:    EKG:  NSR rate 66 LAE otherwise normal 04/25/2023   Recent Labs: 12/07/2022: TSH 1.230   Recent Lipid Panel Lab Results  Component Value Date/Time   CHOL 169 07/20/2022 12:14 PM   TRIG 131 07/20/2022 12:14 PM   HDL 42 07/20/2022 12:14 PM   CHOLHDL 4.0 07/20/2022 12:14 PM   LDLCALC 104 (H) 07/20/2022 12:14 PM    Wt Readings from Last 3 Encounters:  04/25/23 148 lb 9.6 oz (67.4 kg)  12/07/22 152 lb 3.2 oz (69 kg)  07/20/22 150 lb 3.2 oz  (68.1 kg)     Objective:    Vital Signs:  BP 98/66   Pulse 71   Ht 5\' 4"  (1.626 m)   Wt 148 lb 9.6 oz (67.4 kg)   SpO2 97%   BMI 25.51 kg/m    Affect appropriate Healthy:  appears stated age HEENT: normal Neck supple with no adenopathy JVP normal no bruits no thyromegaly Lungs clear with no wheezing and good diaphragmatic motion Heart:  S1/S2 no murmur, no rub, gallop or click PMI normal Abdomen: benighn, BS positve, no tenderness, no AAA no bruit.  No HSM or HJR Distal pulses intact with no bruits No edema Neuro non-focal Skin warm and dry No muscular weakness   ASSESSMENT & PLAN:    1. Mitral Stenosis:  She has been asymptomatic without PAF or severe elevation in estimated PA pressures by echo has been on beta blockers Progression by TTE done 04/04/22 but asymptomatic with no clinical CHF/PAF  Change lopressor to Nadolol  which is less lipophilic than lopressor and may help with her weakness/fatigue in am.   Will need to add diuretic for any dyspnea or edema will update TTE   2. Palpitations :  No documented PAF on multiple monitors most recently 03/01/22 seen by EP Dr Ladona Ridgel self limited SVT continue beta blocker   Echo for MS  D/C lopressor Start Nadolol 40 mg daily    Disposition:  Follow up 6 months   Signed, Charlton Haws, MD  04/25/2023 4:32 PM    [facility]

## 2023-04-23 ENCOUNTER — Telehealth: Payer: Self-pay | Admitting: Cardiovascular Disease

## 2023-04-23 NOTE — Telephone Encounter (Signed)
Pt advised that they will address this request at her OV in 2 days. Informed that she would not be able to get echo same day as appt since so appt day is so close. Patient verbalized understanding and agreeable to plan.

## 2023-04-23 NOTE — Telephone Encounter (Signed)
Patient wants to get orders for her yearly heart ultrasound.

## 2023-04-25 ENCOUNTER — Encounter: Payer: Self-pay | Admitting: Cardiovascular Disease

## 2023-04-25 ENCOUNTER — Ambulatory Visit: Payer: PRIVATE HEALTH INSURANCE | Attending: Cardiovascular Disease | Admitting: Cardiovascular Disease

## 2023-04-25 VITALS — BP 98/66 | HR 71 | Ht 64.0 in | Wt 148.6 lb

## 2023-04-25 DIAGNOSIS — I471 Supraventricular tachycardia, unspecified: Secondary | ICD-10-CM | POA: Diagnosis not present

## 2023-04-25 DIAGNOSIS — I050 Rheumatic mitral stenosis: Secondary | ICD-10-CM | POA: Diagnosis not present

## 2023-04-25 MED ORDER — NADOLOL 40 MG PO TABS: 40.0000 mg | ORAL_TABLET | Freq: Every day | ORAL | 3 refills | Status: DC

## 2023-04-25 MED ORDER — ATENOLOL 50 MG PO TABS: 50.0000 mg | ORAL_TABLET | Freq: Every day | ORAL | 3 refills | Status: DC

## 2023-04-25 NOTE — Patient Instructions (Addendum)
Medication Instructions:  Your physician recommends that you continue on your current medications as directed. Please refer to the Current Medication list given to you today. START nadolol 40 mg by mouth daily after your last dose of metoprolol.  *If you need a refill on your cardiac medications before your next appointment, please call your pharmacy*  Lab Work: If you have labs (blood work) drawn today and your tests are completely normal, you will receive your results only by: MyChart Message (if you have MyChart) OR A paper copy in the mail If you have any lab test that is abnormal or we need to change your treatment, we will call you to review the results.  Testing/Procedures: Your physician has requested that you have an echocardiogram. Echocardiography is a painless test that uses sound waves to create images of your heart. It provides your doctor with information about the size and shape of your heart and how well your heart's chambers and valves are working. This procedure takes approximately one hour. There are no restrictions for this procedure. Please do NOT wear cologne, perfume, aftershave, or lotions (deodorant is allowed). Please arrive 15 minutes prior to your appointment time.  Follow-Up: At [facility], you and your health needs are our priority.  As part of our continuing mission to provide you with exceptional heart care, we have created designated Provider Care Teams.  These Care Teams include your primary Cardiologist (physician) and Advanced Practice Providers (APPs -  Physician Assistants and Nurse Practitioners) who all work together to provide you with the care you need, when you need it.  We recommend signing up for the patient portal called "MyChart".  Sign up information is provided on this After Visit Summary.  MyChart is used to connect with patients for Virtual Visits (Telemedicine).  Patients are able to view lab/test results, encounter notes, upcoming  appointments, etc.  Non-urgent messages can be sent to your provider as well.   To learn more about what you can do with MyChart, go to [email or web address].    Your next appointment:   6 month(s)  Provider:   Charlton Haws, MD

## 2023-04-25 NOTE — Addendum Note (Signed)
Addended by: Virl Axe, Yeray Tomas L on: 04/25/2023 05:00 PM   Modules accepted: Orders

## 2023-05-15 ENCOUNTER — Other Ambulatory Visit: Payer: Self-pay | Admitting: Family Medicine

## 2023-05-15 ENCOUNTER — Ambulatory Visit: Payer: PRIVATE HEALTH INSURANCE | Admitting: Family Medicine

## 2023-05-15 DIAGNOSIS — Z01419 Encounter for gynecological examination (general) (routine) without abnormal findings: Secondary | ICD-10-CM

## 2023-05-23 ENCOUNTER — Ambulatory Visit (HOSPITAL_COMMUNITY): Payer: PRIVATE HEALTH INSURANCE

## 2023-05-24 ENCOUNTER — Ambulatory Visit: Payer: PRIVATE HEALTH INSURANCE | Admitting: Internal Medicine

## 2023-05-30 ENCOUNTER — Ambulatory Visit (HOSPITAL_COMMUNITY): Payer: PRIVATE HEALTH INSURANCE | Attending: Cardiovascular Disease

## 2023-05-30 DIAGNOSIS — I471 Supraventricular tachycardia, unspecified: Secondary | ICD-10-CM | POA: Diagnosis present

## 2023-05-30 DIAGNOSIS — I050 Rheumatic mitral stenosis: Secondary | ICD-10-CM | POA: Diagnosis not present

## 2023-05-30 LAB — ECHOCARDIOGRAM COMPLETE
Area-P 1/2: 1.29 cm2
MV VTI: 0.65 cm2
S' Lateral: 2.8 cm

## 2023-06-07 ENCOUNTER — Telehealth: Payer: Self-pay | Admitting: Cardiovascular Disease

## 2023-06-07 NOTE — Telephone Encounter (Signed)
Follow Up:       Patient is calling back from 06-03-23, concernng her Echo results.

## 2023-06-07 NOTE — Telephone Encounter (Signed)
Discussed echo results with patient.  Per Dr. Eden Emms: Moderate MS/MR if no symptoms f/u echo in a year she knows she will need MVR in next 1-3 years likely   Patient states she has SOB when walking, going upstairs or lying flat. She said this lasts for about 1-5 minutes and then resolves. She states this is the same as it was when she discussed with Dr. Eden Emms at [facility] in July.  Patient would like to know if the progression of her MS/MR is worse and if so how much worse. She is concerned and does not understand why she can't have MVR procedure now instead of waiting until condition gets worse.  Will forward to Dr. Eden Emms and his nurse to follow up on patient's questions regarding recent echo.  Informed patient Dr. Eden Emms and his nurse are out of the office until next week, she verbalized understanding.

## 2023-06-12 NOTE — Telephone Encounter (Signed)
Per Dr. Eden Emms,  The issue is she's only 73 and will need a 2nd surgery if she has MVR at this age Have her added to one of my off day scehdules at 9:30 and I can arrange right and left cath and referral to Dr Leafy Ro to discuss MVR if she would like   Left message for patient to call back.

## 2023-06-21 ENCOUNTER — Other Ambulatory Visit: Payer: Self-pay | Admitting: Cardiovascular Disease

## 2023-06-25 ENCOUNTER — Telehealth: Payer: Self-pay | Admitting: Cardiovascular Disease

## 2023-06-25 NOTE — Telephone Encounter (Signed)
See previous phone note.

## 2023-06-25 NOTE — Telephone Encounter (Signed)
Patient states she received call from nurse, she is returning call.

## 2023-06-25 NOTE — Telephone Encounter (Signed)
Called patient back. She is aware of Dr. Fabio Bering advisement. Patient needs an afternoon appointment first available is in November. Patient will call if she changes her mind about morning appointments. Patient will also call if she has any issue with the Nadolol.

## 2023-06-25 NOTE — Telephone Encounter (Signed)
Left message for patient to call back  

## 2023-07-23 NOTE — Progress Notes (Deleted)
    SUBJECTIVE:   CHIEF COMPLAINT / HPI:   Menstrual Hx: LMP *** Obstetrical Hx: G2P2 - Pregnancy complications: none - Cesarian sections: none Family Planning: Does not desire pregnancy Contraception: Currently using condoms, has used others in the past. Sexual Hx: Sexually active, one partners. - H/o STIs: none Gynecologic Procedures/Surgeries: none Urinary incontinence symptoms: sometimes incontinence stress with sneezing and coughing, sometimes some urgency Other GYN history: No hx of fibroids, endometriosis, ovarian cysts, PCOS, or infertility. Diet: vegetables with brown rice, actually vegetarian Exercise: 4x days a week goes to gym IPV: *** Pt feels same at home and in current relationships, no past hx of emotional/physical/sexual abuse.   Pap smear: Last one 07/2018, results NILM HPV neg, due today *** Colonoscopy: DUE, never had before *** Mammogram: negative 11/11/2-23, discussed yearly versus biyearly, no breast pain or discharge *** DEXA scan: n/a Skin exam: ***   Family Hx: - Breast cancer: none - Endometrial cancer: none - Cervical cancer: none - Ovarian Cancer: none - Colon Cancer: none   Rheumatic heart disease-  no SOA or chest pain, able to go to gym without symptoms. Taking metoprolol. Sometimes has leg swelling but wears compression stockings. Tolerates gym without symptoms.  PERTINENT  PMH / PSH: ***  OBJECTIVE:   There were no vitals taken for this visit.  General: A&O, NAD HEENT: No sign of trauma, EOM grossly intact Cardiac: RRR, no m/r/g Respiratory: CTAB, normal WOB, no w/c/r GI: Soft, NTTP, non-distended  Extremities: NTTP, no peripheral edema. Neuro: Normal gait, moves all four extremities appropriately. Psych: Appropriate mood and affect   ASSESSMENT/PLAN:   No problem-specific Assessment & Plan notes found for this encounter.     Billey Co, MD [facility]

## 2023-07-24 ENCOUNTER — Encounter: Payer: PRIVATE HEALTH INSURANCE | Admitting: Family Medicine

## 2023-07-24 DIAGNOSIS — Z124 Encounter for screening for malignant neoplasm of cervix: Secondary | ICD-10-CM

## 2023-08-13 ENCOUNTER — Ambulatory Visit: Payer: PRIVATE HEALTH INSURANCE | Admitting: Cardiovascular Disease

## 2023-08-20 NOTE — Progress Notes (Unsigned)
    SUBJECTIVE:   CHIEF COMPLAINT / HPI:   Menstrual Hx: LMP 08/20/23, currently on this Obstetrical Hx: G2P2 - Pregnancy complications: none - Cesarian sections: none Family Planning: Does not desire pregnancy Contraception: Currently using condoms, has used others in the past. Sexual Hx: Sexually active, one partners. - H/o STIs: none Gynecologic Procedures/Surgeries: none Diet:  vegetables with brown rice, actually vegetarian Exercise: 6x week IPV:  Pt feels same at home and in current relationships, no past hx of emotional/physical/sexual abuse.   Pap smear: Last one 07/2018, results NILM HPV neg, due today , will schedule in one week because she is currently on her menses Colonoscopy: DUE, never had before , would like to do FIT testing, no blood in stool but has had hemorrhoids in the past Mammogram: negative 08/18/2022, likes to do bi-yearly, no breast pain or discharge  DEXA scan: n/a Skin exam: no spots   Family Hx: - Breast cancer: none - Endometrial cancer: none - Cervical cancer: none - Ovarian Cancer: none - Colon Cancer: none   Rheumatic heart disease-  no SOA or chest pain, able to go to gym without symptoms unless running really fast on treadmill and then a little dyspnea same as her usual not worse. Switched to nadolol per cardiologist- hasn't noticed any changes. Still has some mild R ankle swelling- had DVT US 06/2023 that was negative, R ankle X-rays negative.   Vaginal discharge- for six months, yellow, some odor, no cramping. Has to wear panty liners and change it. No pain, abdominal pain, fevers or chills. Declines pelvic exam today as she is on her menses but will schedule follow up in 1 week to get swabs. Still with same partner, no history of STI.    OBJECTIVE:   BP 108/70   Pulse 70   Temp 98.3 F (36.8 C)   Wt 154 lb 12.8 oz (70.2 kg)   LMP 08/21/2023 (Exact Date)   SpO2 98%   BMI 26.57 kg/m   General: A&O, NAD HEENT: No sign of trauma, EOM  grossly intact Cardiac: RRR, no m/r/g, no JVD Respiratory: CTAB, normal WOB, no w/c/r GI: Soft, NTTP, non-distended  Extremities: NTTP, trace R ankle edema, no L lower extremity edema Neuro: Normal gait, moves all four extremities appropriately. Psych: Appropriate mood and affect   ASSESSMENT/PLAN:   Assessment & Plan Encounter for well woman exam Mammogram 08/2022 wnl, prefers biyearly Discussed colonoscopy- would like to do FIT test, no blood in stool or history of anemia, did previously have hemorrhoids and discussed this could cause positive FIT in which case would recommend colonscopy Declines COVID booster Due for pap- will reschedule when she is not on her menses Screen for colon cancer FIT test ordered Other fatigue Been present for several months, does snore but no witnessed apnea, consider sleep study Beta blocker could be contributing, switched to nadolol per cardiologist but hasn't noticed a different Screening lab work for anemia and vit B12 as she is vegetarian today, discussed will schedule 1 week f/u to discuss more thoroughly and get her pap smear RHEUMATIC HEART DISEASE Without signs or symptoms of exacerbation, on nadolol per cardiology Lipid panel today BMP today     Billey Co, MD [facility]

## 2023-08-21 ENCOUNTER — Ambulatory Visit (INDEPENDENT_AMBULATORY_CARE_PROVIDER_SITE_OTHER): Payer: PRIVATE HEALTH INSURANCE | Admitting: Family Medicine

## 2023-08-21 ENCOUNTER — Encounter: Payer: Self-pay | Admitting: Family Medicine

## 2023-08-21 VITALS — BP 108/70 | HR 70 | Temp 98.3°F | Wt 154.8 lb

## 2023-08-21 DIAGNOSIS — Z01419 Encounter for gynecological examination (general) (routine) without abnormal findings: Secondary | ICD-10-CM | POA: Insufficient documentation

## 2023-08-21 DIAGNOSIS — Z1211 Encounter for screening for malignant neoplasm of colon: Secondary | ICD-10-CM

## 2023-08-21 DIAGNOSIS — R5383 Other fatigue: Secondary | ICD-10-CM | POA: Diagnosis not present

## 2023-08-21 DIAGNOSIS — I099 Rheumatic heart disease, unspecified: Secondary | ICD-10-CM | POA: Diagnosis not present

## 2023-08-21 DIAGNOSIS — Z124 Encounter for screening for malignant neoplasm of cervix: Secondary | ICD-10-CM | POA: Insufficient documentation

## 2023-08-21 NOTE — Patient Instructions (Signed)
It was wonderful to see you today.  Please bring ALL of your medications with you to every visit.   Today we talked about:  We will do the FIT testing to screen for colon cancer- if this is positive we will talk about a colonoscopy We tested your blood as you are having some fatigue and we can discuss results at follow up in 1 week when we do your pap smear  If you decide you want the COVID booster let us know  Thank you for choosing [facility].   Please call [phone] with any questions about today's appointment.  Please arrive at least 15 minutes prior to your scheduled appointments.   If you had blood work today, I will send you a MyChart message or a letter if results are normal. Otherwise, I will give you a call.   If you had a referral placed, they will call you to set up an appointment. Please give [place] a call if you don't hear back in the next 2 weeks.   If you need additional refills before your next appointment, please call your pharmacy first.   Burley Saver, MD  Family Medicine

## 2023-08-21 NOTE — Assessment & Plan Note (Signed)
Mammogram 08/2022 wnl, prefers biyearly Discussed colonoscopy- would like to do FIT test, no blood in stool or history of anemia, did previously have hemorrhoids and discussed this could cause positive FIT in which case would recommend colonscopy Declines COVID booster Due for pap- will reschedule when she is not on her menses

## 2023-08-21 NOTE — Assessment & Plan Note (Signed)
Without signs or symptoms of exacerbation, on nadolol per cardiology Lipid panel today BMP today

## 2023-08-22 LAB — CBC
Hematocrit: 42.1 % (ref 34.0–46.6)
Hemoglobin: 13.3 g/dL (ref 11.1–15.9)
MCH: 27.9 pg (ref 26.6–33.0)
MCHC: 31.6 g/dL (ref 31.5–35.7)
MCV: 88 fL (ref 79–97)
Platelets: 200 10*3/uL (ref 150–450)
RBC: 4.76 x10E6/uL (ref 3.77–5.28)
RDW: 12.6 % (ref 11.7–15.4)
WBC: 5.2 10*3/uL (ref 3.4–10.8)

## 2023-08-22 LAB — BASIC METABOLIC PANEL
BUN/Creatinine Ratio: 13 (ref 9–23)
BUN: 7 mg/dL (ref 6–24)
CO2: 23 mmol/L (ref 20–29)
Calcium: 9.1 mg/dL (ref 8.7–10.2)
Chloride: 107 mmol/L — ABNORMAL HIGH (ref 96–106)
Creatinine, Ser: 0.56 mg/dL — ABNORMAL LOW (ref 0.57–1.00)
Glucose: 72 mg/dL (ref 70–99)
Potassium: 4.5 mmol/L (ref 3.5–5.2)
Sodium: 140 mmol/L (ref 134–144)
eGFR: 113 mL/min/{1.73_m2} (ref >59)

## 2023-08-22 LAB — VITAMIN B12: Vitamin B-12: 1058 pg/mL (ref 232–1245)

## 2023-08-24 LAB — HM MAMMOGRAPHY

## 2023-08-27 ENCOUNTER — Telehealth: Payer: Self-pay | Admitting: Family Medicine

## 2023-08-27 NOTE — Telephone Encounter (Signed)
Called and confirmed that I was speaking with the patient. Discussed that I received mammogram results showing normal left breast and motion artifact of right breast, which made mammogram incomplete and they are requesting repeat views. I recommended calling them back to rescehdule for complete views.  She was thankful for the call and notes she has the number to call them. Answered all questions and concerns. Also let me know that she dropped off the FIT test yesterday, results still pending, Discussed I will let her know when I get them.  Burley Saver MD

## 2023-08-28 LAB — FECAL OCCULT BLOOD, IMMUNOCHEMICAL: Fecal Occult Bld: NEGATIVE

## 2023-09-19 ENCOUNTER — Encounter: Payer: Self-pay | Admitting: Family Medicine

## 2023-09-23 ENCOUNTER — Other Ambulatory Visit (HOSPITAL_COMMUNITY)
Admission: RE | Admit: 2023-09-23 | Discharge: 2023-09-23 | Disposition: A | Discharge status: Home / Self Care | Payer: PRIVATE HEALTH INSURANCE | Source: Ambulatory Visit | Attending: Family Medicine | Admitting: Family Medicine

## 2023-09-23 ENCOUNTER — Encounter: Payer: Self-pay | Admitting: Family Medicine

## 2023-09-23 ENCOUNTER — Ambulatory Visit: Payer: PRIVATE HEALTH INSURANCE | Admitting: Family Medicine

## 2023-09-23 VITALS — BP 96/61 | HR 78 | Ht 63.0 in | Wt 154.6 lb

## 2023-09-23 DIAGNOSIS — Z124 Encounter for screening for malignant neoplasm of cervix: Secondary | ICD-10-CM | POA: Diagnosis present

## 2023-09-23 DIAGNOSIS — R0683 Snoring: Secondary | ICD-10-CM

## 2023-09-23 DIAGNOSIS — R5383 Other fatigue: Secondary | ICD-10-CM | POA: Diagnosis not present

## 2023-09-23 DIAGNOSIS — R058 Other specified cough: Secondary | ICD-10-CM

## 2023-09-23 DIAGNOSIS — N898 Other specified noninflammatory disorders of vagina: Secondary | ICD-10-CM | POA: Diagnosis not present

## 2023-09-23 LAB — POCT WET PREP (WET MOUNT)
Clue Cells Wet Prep Whiff POC: NEGATIVE
Trichomonas Wet Prep HPF POC: ABSENT

## 2023-09-23 MED ORDER — FLUCONAZOLE 150 MG PO TABS: 150.0000 mg | ORAL_TABLET | Freq: Once | ORAL | 0 refills | Status: AC

## 2023-09-23 NOTE — Patient Instructions (Addendum)
It was wonderful to see you today.  Please bring ALL of your medications with you to every visit.   Today we talked about:  We did your pap smear today and checked for causes of vaginal discharge.  We referred you for a sleep study. The pulmonologist will call you for an appointment.   I suspect your cough will continue to get better as your body heals from the virus you had. If you start having fevers, shortness of breath at rest or doing things, chest pain, sputum production, coughing up blood, worsening leg swelling- please call our office or go to ED immediately.  Thank you for choosing [facility].   Please call [phone] with any questions about today's appointment.  Please arrive at least 15 minutes prior to your scheduled appointments.   If you had blood work today, I will send you a MyChart message or a letter if results are normal. Otherwise, I will give you a call.   If you had a referral placed, they will call you to set up an appointment. Please give [place] a call if you don't hear back in the next 2 weeks.   If you need additional refills before your next appointment, please call your pharmacy first.   Burley Saver, MD  Family Medicine

## 2023-09-23 NOTE — Assessment & Plan Note (Signed)
Lungs CTAB today, reassuring without other red flag symptoms Discussed likely post viral cough, supportive care, and red flags to watch for, and expected 4-6 weeks for symptom resolution Consider CXR if not improved in 2-3 more weeks or if new symptoms develop, instructed to call clinic

## 2023-09-23 NOTE — Assessment & Plan Note (Signed)
Pap performed today. 

## 2023-09-23 NOTE — Progress Notes (Addendum)
SUBJECTIVE:   CHIEF COMPLAINT / HPI:   Vaginal discharge- also Presents for pap today. Was having vaginal discharge but didn't want pelvic at last visit bc on periods, regular menses monthly. For past six months, some itching, no pain, some odor. One partner, would like STI testing today. Clear/yellow.   Coughing- she notes on 08/27/23 was diagnosed with viral URI, had three days rhinorrhea, cough, other symptoms. Those resolved, and she continues to have dry cough. NO fevers or chills, no sputum production or hemoptysis, no shortness of breath at rest or with exertion, no chest pain, no worsening leg swelling. Wondering if there is anything else to do for this.  Fatigue- been present for many months. Was switched to nadolol by  her cardiologist but just started 2 days ago as she still had a 90 day script of her metoprolol to finish. Denies shortness of breath, dyspnea, chest pain. Does sleep well but feels tired at end of work and sometimes can't drive due to her fatigue. Does snore at night, no apnea that she is aware of. PHQ9 10, originally patient put positive on question 9- noted after the visit and called her and she states she had misread and denies ever having thoughts of not wanting to be here or wanting to hurt herself. Chart updated. She notes feeling down sometimes but mostly about getting sick and still coughing, or her other health issues. She does not feel like she is depressed. We discussed both counseling and medications and she does not want to do either of these at this time. Also discussed how mood can affect fatigue and sleep and can definitely be contributing.    PERTINENT  PMH / PSH: rheumatic heart disease  OBJECTIVE:   BP 96/61   Pulse 78   Ht 5\' 3"  (1.6 m)   Wt 154 lb 9.6 oz (70.1 kg)   SpO2 100%   BMI 27.39 kg/m   General: A&O, NAD HEENT: No sign of trauma, EOM grossly intact, neck circumference 39 cm Cardiac: RRR, no m/r/g Respiratory: CTAB, normal WOB, no  w/c/r GI: Soft, NTTP, non-distended  Extremities: NTTP, no peripheral edema. Neuro: Normal gait, moves all four extremities appropriately. Psych: Appropriate mood and affect  GU: Normal appearance of labia majora and minora, without lesions. Vagina tissue pink, moist, without lesions or abrasions. Cervix normal appearance, non-friable, without discharge from os.   Drusilla Kanner CMA present as chaperone for pelvic exam.    ASSESSMENT/PLAN:   Assessment & Plan Encounter for Papanicolaou smear for cervical cancer screening Pap performed today Vaginal discharge Wet prep, GC/Chlamydia testing today Other fatigue Suspect could be related to sleep apnea, previous TSH and anemia testing wnl Could also be related to B-blocker, has recently switched in past two days to nadolol and can monitor to see if changes/improvement to symptoms PHQ9 also positive, but she denies SI or thoughts or not wanting to be here, discussed role of mood in health/fatigue and offered counseling and medications and she declines at this time She notes she filled questionnaire wrong and thus updated in chart, but I did discuss if she ever had these thoughts emergency resources as well as 988 hotline Snoring STOP BANG 3, intermediate risk of sleep apnea Referral to pulmonology for sleep study Post-viral cough syndrome Lungs CTAB today, reassuring without other red flag symptoms Discussed likely post viral cough, supportive care, and red flags to watch for, and expected 4-6 weeks for symptom resolution Consider CXR if not improved in 2-3  more weeks or if new symptoms develop, instructed to call clinic  Update: wet prep positive for yeast, called pt and confirmed no allergies, diflucan x1 sent to pharmacy.   Billey Co, MD [facility]

## 2023-09-24 ENCOUNTER — Other Ambulatory Visit: Payer: Self-pay

## 2023-09-24 ENCOUNTER — Encounter: Payer: Self-pay | Admitting: Family Medicine

## 2023-09-25 LAB — CYTOLOGY - PAP
Chlamydia: NEGATIVE
Comment: NEGATIVE
Comment: NEGATIVE
Comment: NEGATIVE
Comment: NORMAL
Diagnosis: NEGATIVE
High risk HPV: NEGATIVE
Neisseria Gonorrhea: NEGATIVE
Trichomonas: NEGATIVE

## 2023-11-15 NOTE — Progress Notes (Deleted)
 Date:  11/15/2023   ID:  Megan Atkins, Vessey Jan 24, 1976, MRN [id number]   PCP:  Billey Co, MD  Cardiologist:   Eden Emms Electrophysiologist:  None   Evaluation Performed:  Follow-Up Visit  Chief Complaint:  Rheumatic mitral valve disease  History of Present Illness:    48 y.o. with history of moderate MS and rheumatic mitral valve disease Echo March 2018 reviewed EF 60-65% moderate MS mild MR Valve area by PT1/2 1.35 cm2  Moderate LAE estimated PA 39 mmHg mean gradient 9 mmHg peak 10 mmHg History of palpitations with no PAF documented by monitor 2018, 2020 and more recently 03/01/22 Seen by [facility] for self limited SVT no Rx other than beta blocker suggested   ETT 11/04/20 only 67% PMHR no arrhythmia or ST changes  TTE 11/04/20 EF strain normal moderate LAE mild RAE MVA 1.3 cm2 mild MR Mild/Mod MS mean gradient 8.5 mmHg  Moderate TR   TTE 04/04/22 severe LAE mild/mod MR moderate/severe MS mean gradient 10 mmHg MVA 1.27 cm2 but < 1 cm2 by VTI and peak gradient 18 mmHg at HR of 74 bpm mild elevation in PA systolic pressure   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 She is working full time Only gets mild dyspnea going up stairs. Feels improved with lopressor   Discussed timing of MVR.  She is still young and would be nice to wait as long as possible Given lack of CHF, PAF and symptoms favor continued beta blocker Rx and serial TTE;s   She has some fatigue and tiredness and wonders if it is from lopressor However she has been on this for a long time and new symptoms only this year She did not like Atenolol in past ? Reason Discussed trying Nadolol started 04/25/23   TTE 05/30/23 reviewed  EF 65-70% normal strain. Severe bi atrial enlargement Rheumatic MV with moderate MR/MS mean diastolic gradient 7 mmhg  peak 19.9 mmHg with MVA VTI 0.65 cm2  at HR of 63 bpm. MVA by deceleration time 1.44 cm2.   ***    Past Medical History:  Diagnosis Date    Hemorrhoid 11/2012   Mitral stenosis    last echo 05/2011 at [facility]   Palpitations    Rheumatic heart disease    Past Surgical History:  Procedure Laterality Date   HEMORRHOID SURGERY N/A 11/18/2012   Procedure: HEMORRHOIDECTOMY;  Surgeon: Wilmon Arms. Corliss Skains, MD;  Location: [facility];  Service: General;  Laterality: N/A;   NO PAST SURGERIES       No outpatient medications have been marked as taking for the 11/26/23 encounter (Appointment) with Wendall Stade, MD.     Allergies:   Patient has no known allergies.   Social History   Tobacco Use   Smoking status: Never   Smokeless tobacco: Never  Vaping Use   Vaping status: Never Used  Substance Use Topics   Alcohol use: No   Drug use: No     Family Hx: The patient's Family history is unknown by patient.  ROS:   Please see the history of present illness.     All other systems reviewed and are negative.   Prior CV studies:   The following studies were reviewed today:  TTE 04/04/22  Monitor 03/21/22   Labs/Other Tests and Data Reviewed:    EKG:  NSR rate 66 LAE otherwise normal 11/15/2023   Recent Labs: 12/07/2022: TSH 1.230  08/21/2023: BUN 7; Creatinine, Ser 0.56; Hemoglobin 13.3; Platelets 200; Potassium 4.5; Sodium 140   Recent Lipid Panel Lab Results  Component Value Date/Time   CHOL 169 07/20/2022 12:14 PM   TRIG 131 07/20/2022 12:14 PM   HDL 42 07/20/2022 12:14 PM   CHOLHDL 4.0 07/20/2022 12:14 PM   LDLCALC 104 (H) 07/20/2022 12:14 PM    Wt Readings from Last 3 Encounters:  09/23/23 154 lb 9.6 oz (70.1 kg)  08/21/23 154 lb 12.8 oz (70.2 kg)  04/25/23 148 lb 9.6 oz (67.4 kg)     Objective:    Vital Signs:  There were no vitals taken for this visit.   Affect appropriate Healthy:  appears stated age HEENT: normal Neck supple with no adenopathy JVP normal no bruits no thyromegaly Lungs clear with no wheezing and good diaphragmatic motion Heart:  S1/S2 no murmur, no rub, gallop  or click PMI normal Abdomen: benighn, BS positve, no tenderness, no AAA no bruit.  No HSM or HJR Distal pulses intact with no bruits No edema Neuro non-focal Skin warm and dry No muscular weakness   ASSESSMENT & PLAN:    1. Mitral Stenosis:  She has been asymptomatic without PAF or severe elevation in estimated PA pressures by echo has been on beta blockers See latest TTE 05/30/23 above. Changed lopressor to Nadolol  which is less lipophilic than lopressor and may help with her weakness/fatigue in am.   Will need to add diuretic for any dyspnea or edema Concern for developing PAF with severe bi atrial enlargement ***  2. Palpitations :  No documented PAF on multiple monitors most recently 03/01/22 seen by EP Dr Ladona Ridgel self limited SVT continue beta blocker   Echo for MS f/u    Disposition:  Follow up 6 months   Signed, Charlton Haws, MD  11/15/2023 1:33 PM    [facility]

## 2023-11-26 ENCOUNTER — Ambulatory Visit: Payer: PRIVATE HEALTH INSURANCE | Admitting: Cardiovascular Disease

## 2023-12-10 ENCOUNTER — Telehealth: Payer: Self-pay | Admitting: Cardiovascular Disease

## 2023-12-10 NOTE — Telephone Encounter (Signed)
*  STAT* If patient is at the pharmacy, call can be transferred to refill team.   1. Which medications need to be refilled? (please list name of each medication and dose if known)   nadolol (CORGARD) 40 MG tablet     2. Would you like to learn more about the convenience, safety, & potential cost savings by using the [facility]? yes   3. Are you open to using the [facility] (Type [facility]. ). yes   4. Which pharmacy/location (including street and city if local pharmacy) is medication to be sent to? [facility] [address], [facility]    5. Do they need a 30 day or 90 day supply? 90 day  Patient wanted to schedule follow up but only wanted to see Dr. Eden Emms and his schedule is currently full. She will call back to schedule at the end of the month. She states she is interested in someone calling her with information on the [facility].

## 2023-12-11 MED ORDER — NADOLOL 40 MG PO TABS
40.0000 mg | ORAL_TABLET | Freq: Every day | ORAL | 1 refills | Status: DC
Start: 2023-12-11 — End: 2024-08-13

## 2023-12-11 NOTE — Telephone Encounter (Signed)
 Pt's medication was sent to pt's pharmacy as requested. Confirmation received.

## 2024-02-11 ENCOUNTER — Other Ambulatory Visit: Payer: Self-pay

## 2024-02-20 ENCOUNTER — Telehealth: Payer: Self-pay | Admitting: Cardiovascular Disease

## 2024-02-20 DIAGNOSIS — I050 Rheumatic mitral stenosis: Secondary | ICD-10-CM

## 2024-02-20 NOTE — Telephone Encounter (Signed)
 New Message:    Patient would like to talk to Dr Francie Irani nurse. She says she would like to have an Ultrasound, the same day as his office visit.

## 2024-02-20 NOTE — Telephone Encounter (Signed)
 Call disconnected after VM was reached.

## 2024-02-21 NOTE — Telephone Encounter (Signed)
 Patient identification verified by 2 forms. Sims Duck, RN     Called and spoke to patient  Patient states:  - She completes annual [facility] and would like her next one done in July on the same day as her appointment.               Interventions/Plan: - Chart review shows patient last echo in September 2024.  Will forward to provider for clarification.   Patient agrees with plan, no questions at this time

## 2024-04-29 ENCOUNTER — Telehealth: Payer: Self-pay

## 2024-04-29 ENCOUNTER — Ambulatory Visit: Payer: PRIVATE HEALTH INSURANCE | Admitting: Cardiovascular Disease

## 2024-04-29 NOTE — Telephone Encounter (Signed)
 Patient calls nurse line requesting results from recent mammogram.   She reports that she had mammogram performed at [facility] in [place].   Per chart review, we have not received report. Report not in provider box.   Called and spoke with imaging center. They are re-faxing report to our office.   Patient would like returned phone call once provider is able to review these results.   Chiquita JAYSON English, RN

## 2024-05-04 NOTE — Telephone Encounter (Signed)
 Hi Hannah,  I still do not have these results. When you called previously, did they say the mammogram has already been read? I tried to look in care everywhere but it shows still pending review and there are no results I can see.  Thanks, [employer]

## 2024-05-06 ENCOUNTER — Other Ambulatory Visit: Payer: Self-pay | Admitting: Family Medicine

## 2024-05-06 DIAGNOSIS — Z01419 Encounter for gynecological examination (general) (routine) without abnormal findings: Secondary | ICD-10-CM

## 2024-05-08 ENCOUNTER — Encounter: Payer: Self-pay | Admitting: Family Medicine

## 2024-05-08 NOTE — Telephone Encounter (Signed)
 Called imaging center, they note last mammogram in their system was from Nov 2024, the results of which we already have in our system, was normal and due for repeat in one year.  Sent mychart message asking pt if she had more recent imaging as the [facility] imaging center has none in their records.  Rollene Keeling MD

## 2024-06-09 NOTE — Progress Notes (Signed)
 Date:  06/16/2024   ID:  Megan Atkins, DOB 03-07-1976, MRN [id number]   PCP:  Megan Rollene BRAVO, MD  Cardiologist:   Megan Atkins:  None   Evaluation Performed:  Follow-Up Visit  Chief Complaint:  Rheumatic mitral valve disease  History of Present Illness:    48 y.o. with history of moderate MS and rheumatic mitral valve disease Echo March 2018 reviewed EF 60-65% moderate MS mild MR Valve area by PT1/2 1.35 cm2  Moderate LAE estimated PA 39 mmHg mean gradient 9 mmHg peak 10 mmHg History of palpitations with no PAF documented by monitor 2018, 2020 and more recently 03/01/22 Seen by Megan Atkins for self limited SVT no Rx other than beta blocker suggested   ETT 11/04/20 only 67% PMHR no arrhythmia or ST changes  TTE 11/04/20 EF strain normal moderate LAE mild RAE MVA 1.3 cm2 mild MR Mild/Mod MS mean gradient 8.5 mmHg  Moderate TR   TTE 04/04/22 severe LAE mild/mod MR moderate/severe MS mean gradient 10 mmHg MVA 1.27 cm2 but < 1 cm2 by VTI and peak gradient 18 mmHg at HR of 74 bpm mild elevation in PA systolic pressure   TTE 05/30/23 EF 60-65% severe LAE moderate MR/MS mean gradient 7 mmHg peak 19.9 mmHg Decel time 589 msec with MVA 1.3 cm2  Prelim viewing todays echo mean gardient 11 mmHg with MVA 1.2 cm2 at HR 63 bpm  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 She is working full time Only gets mild dyspnea going up stairs. Feels improved with lopressor    Discussed timing of MVR.  She is still young and would be nice to wait as long as possible Given lack of CHF, PAF and symptoms favor continued beta blocker Rx and serial TTE;s   Beta blocker changed to nadolol  due to fatigue and weakness 12/11/23   Past Medical History:  Diagnosis Date   Hemorrhoid 11/2012   Mitral stenosis    last echo 05/2011 at [facility]   Palpitations    Rheumatic heart disease     Past Surgical History:  Procedure Laterality Date   HEMORRHOID SURGERY N/A 11/18/2012   Procedure: HEMORRHOIDECTOMY;  Surgeon: Megan POUR. Belinda, MD;  Location: [facility];  Service: General;  Laterality: N/A;   NO PAST SURGERIES       Current Meds  Medication Sig   nadolol  (CORGARD ) 40 MG tablet Take 1 tablet (40 mg total) by mouth daily.   vitamin B-12 (CYANOCOBALAMIN) 100 MCG tablet TAKE 1 TABLET BY MOUTH EVERY DAY     Allergies:   Patient has no known allergies.   Social History   Tobacco Use   Smoking status: Never   Smokeless tobacco: Never  Vaping Use   Vaping status: Never Used  Substance Use Topics   Alcohol use: No   Drug use: No     Family Hx: The patient's Family history is unknown by patient.  ROS:   Please see the history of present illness.     All other systems reviewed and are negative.   Prior CV studies:   The following studies were reviewed today:  TTE 04/04/22  Monitor 03/21/22   Labs/Other Tests and Data Reviewed:    EKG:  NSR rate 60 normal 06/16/2024   Recent Labs: 08/21/2023: BUN 7; Creatinine, Ser 0.56; Hemoglobin 13.3; Platelets 200; Potassium 4.5;  Sodium 140   Recent Lipid Panel Lab Results  Component Value Date/Time   CHOL 169 07/20/2022 12:14 PM   TRIG 131 07/20/2022 12:14 PM   HDL 42 07/20/2022 12:14 PM   CHOLHDL 4.0 07/20/2022 12:14 PM   LDLCALC 104 (H) 07/20/2022 12:14 PM    Wt Readings from Last 3 Encounters:  06/16/24 154 lb 9.6 oz (70.1 kg)  09/23/23 154 lb 9.6 oz (70.1 kg)  08/21/23 154 lb 12.8 oz (70.2 kg)     Objective:    Vital Signs:  BP 104/68   Pulse 60   Ht 5' 3 (1.6 m)   Wt 154 lb 9.6 oz (70.1 kg)   SpO2 99%   BMI 27.39 kg/m    Affect appropriate Healthy:  appears stated age HEENT: normal Neck supple with no adenopathy JVP normal no bruits no thyromegaly Lungs clear with no wheezing and good diaphragmatic motion Heart:  S1/S2 no murmur, no rub, gallop or click PMI  normal Abdomen: benighn, BS positve, no tenderness, no AAA no bruit.  No HSM or HJR Distal pulses intact with no bruits No edema Neuro non-focal Skin warm and dry No muscular weakness   ASSESSMENT & PLAN:    1. Mitral Stenosis:  She has been asymptomatic without PAF or severe elevation in estimated PA pressures by echo has been on beta blockers Moderate MS/MR on TTE.  Will need to add diuretic for any dyspnea or edema TTE today stable mean gradient and MVA 1.2 cm2   2. Palpitations :  No documented PAF on multiple monitors most recently 03/01/22 seen by EP Megan Atkins self limited SVT continue beta blocker   Echo for MS in a year    Disposition:  Follow up 6 months   Signed, Megan Emmer, MD  06/16/2024 8:34 AM    [facility]

## 2024-06-16 ENCOUNTER — Ambulatory Visit (INDEPENDENT_AMBULATORY_CARE_PROVIDER_SITE_OTHER): Payer: PRIVATE HEALTH INSURANCE | Admitting: Cardiovascular Disease

## 2024-06-16 ENCOUNTER — Ambulatory Visit (HOSPITAL_COMMUNITY)
Admission: RE | Admit: 2024-06-16 | Discharge: 2024-06-16 | Disposition: A | Discharge status: Home / Self Care | Payer: PRIVATE HEALTH INSURANCE | Source: Ambulatory Visit | Attending: Internal Medicine | Admitting: Internal Medicine

## 2024-06-16 ENCOUNTER — Encounter: Payer: Self-pay | Admitting: Cardiovascular Disease

## 2024-06-16 VITALS — BP 104/68 | HR 60 | Ht 63.0 in | Wt 154.6 lb

## 2024-06-16 DIAGNOSIS — I471 Supraventricular tachycardia, unspecified: Secondary | ICD-10-CM

## 2024-06-16 DIAGNOSIS — I050 Rheumatic mitral stenosis: Secondary | ICD-10-CM

## 2024-06-16 LAB — ECHOCARDIOGRAM COMPLETE
Area-P 1/2: 1.48 cm2
MV VTI: 0.74 cm2
P 1/2 time: 202 ms

## 2024-06-16 NOTE — Patient Instructions (Addendum)
 Medication Instructions:  Your physician recommends that you continue on your current medications as directed. Please refer to the Current Medication list given to you today.  *If you need a refill on your cardiac medications before your next appointment, please call your pharmacy*  Lab Work: If you have labs (blood work) drawn today and your tests are completely normal, you will receive your results only by: MyChart Message (if you have MyChart) OR A paper copy in the mail If you have any lab test that is abnormal or we need to change your treatment, we will call you to review the results.  Testing/Procedures: None ordered today.  Follow-Up: At [facility], you and your health needs are our priority.  As part of our continuing mission to provide you with exceptional heart care, our providers are all part of one team.  This team includes your primary Cardiologist (physician) and Advanced Practice Providers or APPs (Physician Assistants and Nurse Practitioners) who all work together to provide you with the care you need, when you need it.  Your next appointment:   12 months  Provider:   Dr. Delford  We recommend signing up for the patient portal called MyChart.  Sign up information is provided on this After Visit Summary.  MyChart is used to connect with patients for Virtual Visits (Telemedicine).  Patients are able to view lab/test results, encounter notes, upcoming appointments, etc.  Non-urgent messages can be sent to your provider as well.   To learn more about what you can do with MyChart, go to [email or web address].   Other Instructions

## 2024-07-21 ENCOUNTER — Ambulatory Visit: Payer: PRIVATE HEALTH INSURANCE | Admitting: Family Medicine

## 2024-08-12 ENCOUNTER — Other Ambulatory Visit: Payer: Self-pay | Admitting: Cardiovascular Disease

## 2024-08-28 ENCOUNTER — Encounter: Payer: Self-pay | Admitting: Family Medicine

## 2024-08-28 ENCOUNTER — Ambulatory Visit (INDEPENDENT_AMBULATORY_CARE_PROVIDER_SITE_OTHER): Payer: PRIVATE HEALTH INSURANCE | Admitting: Family Medicine

## 2024-08-28 VITALS — BP 112/75 | HR 73 | Ht 63.0 in | Wt 155.8 lb

## 2024-08-28 DIAGNOSIS — L600 Ingrowing nail: Secondary | ICD-10-CM

## 2024-08-28 DIAGNOSIS — I050 Rheumatic mitral stenosis: Secondary | ICD-10-CM | POA: Diagnosis not present

## 2024-08-28 DIAGNOSIS — R17 Unspecified jaundice: Secondary | ICD-10-CM

## 2024-08-28 DIAGNOSIS — Z23 Encounter for immunization: Secondary | ICD-10-CM | POA: Diagnosis not present

## 2024-08-28 DIAGNOSIS — Z1211 Encounter for screening for malignant neoplasm of colon: Secondary | ICD-10-CM

## 2024-08-28 NOTE — Patient Instructions (Addendum)
 It was wonderful to see you today.  Please bring ALL of your medications with you to every visit.   Today we talked about:  - Please send me your ultrasound report for your ankle- we may need to get an MRI   I have requested your mammogram results.  I gave you a stool card to send back in to screen for colon cancer  We gave you the pneumonia shot.   Thank you for choosing [facility].   Please call [phone] with any questions about today's appointment.  Please arrive at least 15 minutes prior to your scheduled appointments.   If you had blood work today, I will send you a MyChart message or a letter if results are normal. Otherwise, I will give you a call.   If you had a referral placed, they will call you to set up an appointment. Please give us  a call if you don't hear back in the next 2 weeks.   If you need additional refills before your next appointment, please call your pharmacy first.  Don't forget to check out the [facility] in [facility] at [address] [phone] Affordable prices on prescriptions and over-the-counter items, as well as services like vaccinations and medication home delivery.   Rollene Keeling, MD  Family Medicine

## 2024-08-28 NOTE — Progress Notes (Signed)
    SUBJECTIVE:   CHIEF COMPLAINT / HPI: annual physical  Ingrown toenail- happens on her second toe on both feet  Rheumatic mitral stenosis, SVT- follows with cardiology, switched to nadolol  and no longer has fatigue and is feeling much better. Denies chest pain, shortness of breath, palpitations, orthopnea, DOE, or leg swelling.  Right ankle pain/swelling- persists, had a flare a few months ago. Able to walk but painful. Saw SM at [facility] who did an US  and she did not get an injection. Checked her uric acid which was normal. Has done X-ray with me previously. NO new injury.   Elevated total bilirubin- no jaundice, noted on labs in August.   HCM Mammogram- had April 04, 2024- need to request records Colonoscopy- did FIT test last year, due for another Pneumonia shot- will get today  OBJECTIVE:   BP 112/75   Pulse 73   Ht 5' 3 (1.6 m)   Wt 155 lb 12.8 oz (70.7 kg)   LMP 08/17/2024   SpO2 99%   BMI 27.60 kg/m   General: A&O, NAD HEENT: No sign of trauma, EOM grossly intact Cardiac: RRR, no m/r/g Respiratory: CTAB, normal WOB, no w/c/r GI: Soft, NTTP, non-distended  Extremities: NTTP, no peripheral edema. Right ankle mild medial malleolus TTP, no warmth, mild swelling without pitting, full ROM Left foot: second toe thickened and curved into skin, no erythema or purulence or fluctuance noted Neuro: Normal non-antalgic gait, moves all four extremities appropriately. Psych: Appropriate mood and affect   ASSESSMENT/PLAN:   Assessment & Plan Screen for colon cancer FIT testing sent today, no blood in stool Elevated bilirubin Repeat fractionated bilirubin today, asymptomatic Encounter for immunization Pneumonia vaccine given today Rheumatic mitral stenosis Asymptomatic, continue home nadolol , recent CMP with normal kidney function Ingrown toenail of left foot Without signs of infection Offered return for partial avulsion/removal versus podiatry referral as she gets this  often and thickened nail suggesting trauma and may benefit from inserts, she wants to consider and let me know what she decides   Records request signed to request results of June 2025 mammogram.    Megan FORBES Keeling, MD [facility]

## 2024-08-28 NOTE — Assessment & Plan Note (Signed)
 Asymptomatic, continue home nadolol , recent CMP with normal kidney function

## 2024-08-29 LAB — HEPATIC FUNCTION PANEL
ALT: 17 IU/L (ref 0–32)
AST: 18 IU/L (ref 0–40)
Albumin: 4.4 g/dL (ref 3.9–4.9)
Alkaline Phosphatase: 69 IU/L (ref 41–116)
Bilirubin Total: 1 mg/dL (ref 0.0–1.2)
Bilirubin, Direct: 0.28 mg/dL (ref 0.00–0.40)
Total Protein: 7.2 g/dL (ref 6.0–8.5)

## 2024-08-31 ENCOUNTER — Ambulatory Visit: Payer: Self-pay | Admitting: Family Medicine

## 2024-09-16 LAB — FECAL OCCULT BLOOD, IMMUNOCHEMICAL: Fecal Occult Bld: NEGATIVE

## 2024-11-09 ENCOUNTER — Other Ambulatory Visit: Payer: Self-pay | Admitting: Family Medicine

## 2024-11-09 DIAGNOSIS — Z01419 Encounter for gynecological examination (general) (routine) without abnormal findings: Secondary | ICD-10-CM
